# Patient Record
Sex: Male | Born: 1940
Health system: Southern US, Community
[De-identification: ages and names within clinical notes are randomized; demographics above are authoritative.]

## PROBLEM LIST (undated history)

## (undated) DIAGNOSIS — E119 Type 2 diabetes mellitus without complications: Secondary | ICD-10-CM

## (undated) DIAGNOSIS — I519 Heart disease, unspecified: Secondary | ICD-10-CM

## (undated) DIAGNOSIS — E785 Hyperlipidemia, unspecified: Secondary | ICD-10-CM

## (undated) DIAGNOSIS — G43909 Migraine, unspecified, not intractable, without status migrainosus: Secondary | ICD-10-CM

## (undated) DIAGNOSIS — I219 Acute myocardial infarction, unspecified: Secondary | ICD-10-CM

## (undated) DIAGNOSIS — D649 Anemia, unspecified: Secondary | ICD-10-CM

## (undated) DIAGNOSIS — E611 Iron deficiency: Secondary | ICD-10-CM

## (undated) DIAGNOSIS — I2510 Atherosclerotic heart disease of native coronary artery without angina pectoris: Secondary | ICD-10-CM

## (undated) DIAGNOSIS — M48061 Spinal stenosis, lumbar region without neurogenic claudication: Secondary | ICD-10-CM

## (undated) DIAGNOSIS — H9190 Unspecified hearing loss, unspecified ear: Secondary | ICD-10-CM

## (undated) DIAGNOSIS — M79606 Pain in leg, unspecified: Secondary | ICD-10-CM

## (undated) DIAGNOSIS — M1990 Unspecified osteoarthritis, unspecified site: Secondary | ICD-10-CM

## (undated) DIAGNOSIS — K219 Gastro-esophageal reflux disease without esophagitis: Secondary | ICD-10-CM

## (undated) HISTORY — PX: CARDIAC CATHETERIZATION: SHX172

## (undated) HISTORY — DX: Hyperlipidemia, unspecified: E78.5

## (undated) HISTORY — DX: Type 2 diabetes mellitus without complications: E11.9

## (undated) HISTORY — DX: Migraine, unspecified, not intractable, without status migrainosus: G43.909

## (undated) HISTORY — DX: Atherosclerotic heart disease of native coronary artery without angina pectoris: I25.10

## (undated) HISTORY — DX: Iron deficiency: E61.1

## (undated) HISTORY — DX: Pain in leg, unspecified: M79.606

## (undated) HISTORY — PX: CORONARY ARTERY BYPASS GRAFT: SHX141

## (undated) HISTORY — DX: Unspecified hearing loss, unspecified ear: H91.90

## (undated) HISTORY — DX: Heart disease, unspecified: I51.9

## (undated) HISTORY — DX: Gastro-esophageal reflux disease without esophagitis: K21.9

## (undated) HISTORY — DX: Spinal stenosis, lumbar region without neurogenic claudication: M48.061

---

## 2006-08-05 DIAGNOSIS — I219 Acute myocardial infarction, unspecified: Secondary | ICD-10-CM

## 2006-08-05 HISTORY — DX: Acute myocardial infarction, unspecified: I21.9

## 2015-02-22 ENCOUNTER — Ambulatory Visit (INDEPENDENT_AMBULATORY_CARE_PROVIDER_SITE_OTHER): Payer: Medicaid Other | Admitting: Neurology

## 2015-02-22 ENCOUNTER — Encounter: Payer: Self-pay | Admitting: Neurology

## 2015-02-22 ENCOUNTER — Telehealth: Payer: Self-pay

## 2015-02-22 VITALS — BP 135/76 | HR 65 | Ht 64.0 in | Wt 157.6 lb

## 2015-02-22 DIAGNOSIS — E0842 Diabetes mellitus due to underlying condition with diabetic polyneuropathy: Secondary | ICD-10-CM

## 2015-02-22 DIAGNOSIS — R200 Anesthesia of skin: Secondary | ICD-10-CM

## 2015-02-22 DIAGNOSIS — R202 Paresthesia of skin: Secondary | ICD-10-CM | POA: Diagnosis not present

## 2015-02-22 DIAGNOSIS — M5417 Radiculopathy, lumbosacral region: Secondary | ICD-10-CM | POA: Diagnosis not present

## 2015-02-22 DIAGNOSIS — E1140 Type 2 diabetes mellitus with diabetic neuropathy, unspecified: Secondary | ICD-10-CM | POA: Insufficient documentation

## 2015-02-22 MED ORDER — METANX 3-35-2 MG PO TABS: 1.0000 | ORAL_TABLET | Freq: Every day | ORAL | Status: DC

## 2015-02-22 MED ORDER — TOPIRAMATE 25 MG PO TABS: 25.0000 mg | ORAL_TABLET | Freq: Two times a day (BID) | ORAL | Status: DC

## 2015-02-22 NOTE — Progress Notes (Signed)
[facility] [address] [place]. [place] [id number] [phone]       OFFICE CONSULT NOTE  Mr. Paul Villarreal Date of Birth:  12/18/1940 Medical Record Number:  [id number]   Referring MD:  Kathryne SharperSyed Arfeen Reason for Referral:  Thigh and leg pain and numbness HPI: Mr Paul Villarreal is a pleasant [place] male who has had long-standing thigh and leg pain and paresthesias for the last couple of years. He is accompanied by his daughter who is a Therapist, sportspsychiatrist as well as son-in-law who both help with the history. These seem to be more pronounced over the last 6-7 months. He describes a burning sensation on numbness on the top of his feet which is intermittent and more noticeable with prolonged walking. He does find some partial relief by resting and sitting. He does notices at night but it's not quite bothersome and he does not have to get up or is not unable to sleep because of this. Describes also pain in his buttock more on the right compared to the left which often radiates down the back of his thighs of the knee but never all the way down into the toes. This is described at times has shooting pain at times is pulling sensation. He denies any severe back pain or pain shooting from the back down the leg. He had previously been diagnosed with spinal stenosis aspirin and an MRI done in [place] several years ago. However he is unable to provide me with details and I do not have those records to review. He does have a history of long-standing diabetes but it appears to be well controlled for the last hemoglobin A1c being 6.2. He was seen by a neurologist in [place] last year and treated with a 3 week course of stay regards but I do not have any records to review. He he was prescribed Neurontin 100 mg by primary physician which he took only for a few weeks and discontinued as he felt it did not help his symptoms. He denies any back injury, herniated disc, gait or balance problems.  He has learned to be careful while climbing steps holding onto the railings but denies any significant balance problems in the dark or in the shower.  ROS:   14 system review of systems is positive for back pain, thigh pain, leg pain, tingling, numbness, gait and balance difficulties and all other systems negative  PMH:  Past Medical History  Diagnosis Date  . Diabetes mellitus without complication   . Hyperlipemia   . Heart disease   . GERD (gastroesophageal reflux disease)   . Degenerative lumbar spinal stenosis   . Coronary artery disease   . Iron deficiency   . Decreased hearing   . Lower extremity pain   . Nonintractable chronic migraine     Social History:  History   Social History  . Marital Status: Married    Spouse Name: N/A  . Number of Children: 3  . Years of Education: N/A   Occupational History  . retired    Social History Main Topics  . Smoking status: Never Smoker   . Smokeless tobacco: Not on file  . Alcohol Use: No  . Drug Use: No  . Sexual Activity: Not on file   Other Topics Concern  . Not on file   Social History Narrative   Lives with daughter and wife   2 cups of caffeine a day        Medications:   No  current outpatient prescriptions on file prior to visit.   No current facility-administered medications on file prior to visit.    Allergies:  No Known Allergies  Physical Exam General: well developed, well nourished, seated, in no evident distress Head: head normocephalic and atraumatic.   Neck: supple with no carotid or supraclavicular bruits Cardiovascular: regular rate and rhythm, no murmurs Musculoskeletal: no deformity. Straight leg raising test shows limitation of right hip flexion to 70 due to muscle spasm and left leg 80. No paraspinal spasm or tenderness Skin:  no rash/petichiae Vascular:  Normal pulses all extremities  Neurologic Exam Mental Status: Awake and fully alert. Oriented to place and time. Recent and remote  memory intact. Attention span, concentration and fund of knowledge appropriate. Mood and affect appropriate.  Cranial Nerves: Fundoscopic exam reveals not done  . Pupils equal, briskly reactive to light. Extraocular movements full without nystagmus. Visual fields full to confrontation. Hearing intact. Facial sensation intact. Face, tongue, palate moves normally and symmetrically.  Motor: Normal bulk and tone. Normal strength in all tested extremity muscles. Sensory.: intact to touch , pinprick , position and vibratory sensation. Mild hyperesthesia over her bottom of both feet. Impaired vibration sensation from ankle down left foot more than right. Romberg sign is negative. Position sense is preserved bilaterally. Coordination: Rapid alternating movements normal in all extremities. Finger-to-nose and heel-to-shin performed accurately bilaterally. Gait and Station: Arises from chair without difficulty. Stance is normal. Gait demonstrates normal stride length and balance . Able to heel, toe and tandem walk with minimal difficulty.  Reflexes: 1+ and symmetric except both ankle jerks are absent. Toes downgoing.       ASSESSMENT: 20 year [place] male with a long-standing history of lower extremity paresthesias likely from underlying diabetic sensory neuropathy and posterior thigh and buttock pain possibly from S1 radiculopathy and lumbar spinal stenosis.    PLAN: I had a long discussion with the patient, son-in-law and daughter with regards to his chronic symptoms of paresthesias in the feet as well as posterior thigh pain likely represents S1 radiculopathy from degenerative spine disease with a component of underlying diabetic neuropathy. I recommend a trial of Topamax 25 g twice daily to be increase if tolerated without side effects. I have discussed side effects with the patient and advised him to call me if needed. Check EMG nerve conduction study to look at the relative contribution of S1  radiculopathy versus diabetic neuropathy. I have also given him regular back stretching exercises. I also given a prescription of Metanx to help with neuropathic pain. I have also asked the family to get me records from his prior MRI scan done in [place] several years ago for comparison He was advised to return for follow-up in 2 months or call earlier if necessary. Delia Heady, MD  Note: This document was prepared with digital dictation and possible smart phrase technology. Any transcriptional errors that result from this process are unintentional.

## 2015-02-22 NOTE — Patient Instructions (Addendum)
I had a long discussion with the patient, son-in-law and daughter with regards to his chronic symptoms of paresthesias in the feet as well as posterior thigh pain likely represents S1 radiculopathy from degenerative spine disease with a component of underlying diabetic neuropathy. I recommend a trial of Topamax 25 g twice daily to be increase if tolerated without side effects. I have discussed side effects with the patient and advised him to call me if needed. Check EMG nerve conduction study to look at ther relative contribution of S1 radiculopathy versus diabetic neuropathy. I have also given him regular back stretching exercises. I also given a prescription of Metanx to help with neuropathic pain. I have also asked the family to get me records from his prior MRI scan done in [place] several years ago for comparison He was advised to return for follow-up in 2 months or call earlier if necessary.  Diabetic Neuropathy Diabetic neuropathy is a nerve disease or nerve damage that is caused by diabetes mellitus. About half of all people with diabetes mellitus have some form of nerve damage. Nerve damage is more common in those who have had diabetes mellitus for many years and who generally have not had good control of their blood sugar (glucose) level. Diabetic neuropathy is a common complication of diabetes mellitus. There are three more common types of diabetic neuropathy and a fourth type that is less common and less understood:   Peripheral neuropathy--This is the most common type of diabetic neuropathy. It causes damage to the nerves of the feet and legs first and then eventually the hands and arms.The damage affects the ability to sense touch.  Autonomic neuropathy--This type causes damage to the autonomic nervous system, which controls the following functions:  Heartbeat.  Body temperature.  Blood pressure.  Urination.  Digestion.  Sweating.  Sexual function.  Focal neuropathy--Focal  neuropathy can be painful and unpredictable and occurs most often in older adults with diabetes mellitus. It involves a specific nerve or one area and often comes on suddenly. It usually does not cause long-term problems.  Radiculoplexus neuropathy-- Sometimes called lumbosacral radiculoplexus neuropathy, radiculoplexus neuropathy affects the nerves of the thighs, hips, buttocks, or legs. It is more common in people with type 2 diabetes mellitus and in older men. It is characterized by debilitating pain, weakness, and atrophy, usually in the thigh muscles. CAUSES  The cause of peripheral, autonomic, and focal neuropathies is diabetes mellitus that is uncontrolled and high glucose levels. The cause of radiculoplexus neuropathy is unknown. However, it is thought to be caused by inflammation related to uncontrolled glucose levels. SIGNS AND SYMPTOMS  Peripheral Neuropathy Peripheral neuropathy develops slowly over time. When the nerves of the feet and legs no longer work there may be:   Burning, stabbing, or aching pain in the legs or feet.  Inability to feel pressure or pain in your feet. This can lead to:  Thick calluses over pressure areas.  Pressure sores.  Ulcers.  Foot deformities.  Reduced ability to feel temperature changes.  Muscle weakness. Autonomic Neuropathy The symptoms of autonomic neuropathy vary depending on which nerves are affected. Symptoms may include:  Problems with digestion, such as:  Feeling sick to your stomach (nausea).  Vomiting.  Bloating.  Constipation.  Diarrhea.  Abdominal pain.  Difficulty with urination. This occurs if you lose your ability to sense when your bladder is full. Problems include:  Urine leakage (incontinence).  Inability to empty your bladder completely (retention).  Rapid or irregular heartbeat (palpitations).  Blood pressure drops when you stand up (orthostatic hypotension). When you stand up you may  feel:  Dizzy.  Weak.  Faint.  In men, inability to attain and maintain an erection.  In women, vaginal dryness and problems with decreased sexual desire and arousal.  Problems with body temperature regulation.  Increased or decreased sweating. Focal Neuropathy  Abnormal eye movements or abnormal alignment of both eyes.  Weakness in the wrist.  Foot drop. This results in an inability to lift the foot properly and abnormal walking or foot movement.  Paralysis on one side of your face (Bell palsy).  Chest or abdominal pain. Radiculoplexus Neuropathy  Sudden, severe pain in your hip, thigh, or buttocks.  Weakness and wasting of thigh muscles.  Difficulty rising from a seated position.  Abdominal swelling.  Unexplained weight loss (usually more than 10 lb [4.5 kg]). DIAGNOSIS  Peripheral Neuropathy Your senses may be tested. Sensory function testing can be done with:  A light touch using a monofilament.  A vibration with tuning fork.  A sharp sensation with a pin prick. Other tests that can help diagnose neuropathy are:  Nerve conduction velocity. This test checks the transmission of an electrical current through a nerve.  Electromyography. This shows how muscles respond to electrical signals transmitted by nearby nerves.  Quantitative sensory testing. This is used to assess how your nerves respond to vibrations and changes in temperature. Autonomic Neuropathy Diagnosis is often based on reported symptoms. Tell your health care provider if you experience:   Dizziness.   Constipation.   Diarrhea.   Inappropriate urination or inability to urinate.   Inability to get or maintain an erection.  Tests that may be done include:   Electrocardiography or Holter monitor. These are tests that can help show problems with the heart rate or heart rhythm.   An X-ray exam may be done. Focal Neuropathy Diagnosis is made based on your symptoms and what your  health care provider finds during your exam. Other tests may be done. They may include:  Nerve conduction velocities. This checks the transmission of electrical current through a nerve.  Electromyography. This shows how muscles respond to electrical signals transmitted by nearby nerves.  Quantitative sensory testing. This test is used to assess how your nerves respond to vibration and changes in temperature. Radiculoplexus Neuropathy  Often the first thing is to eliminate any other issue or problems that might be the cause, as there is no stick test for diagnosis.  X-ray exam of your spine and lumbar region.  Spinal tap to rule out cancer.  MRI to rule out other lesions. TREATMENT  Once nerve damage occurs, it cannot be reversed. The goal of treatment is to keep the disease or nerve damage from getting worse and affecting more nerve fibers. Controlling your blood glucose level is the key. Most people with radiculoplexus neuropathy see at least a partial improvement over time. You will need to keep your blood glucose and HbA1c levels in the target range determined by your health care provider. Things that help control blood glucose levels include:   Blood glucose monitoring.   Meal planning.   Physical activity.   Diabetes medicine.  Over time, maintaining lower blood glucose levels helps lessen symptoms. Sometimes, prescription pain medicine is needed. HOME CARE INSTRUCTIONS:  Do not smoke.  Keep your blood glucose level in the range that you and your health care provider have determined acceptable for you.  Keep your blood pressure level in the range that  you and your health care provider have determined acceptable for you.  Eat a well-balanced diet.  Be active every day.  Check your feet every day. SEEK MEDICAL CARE IF:   You have burning, stabbing, or aching pain in the legs or feet.  You are unable to feel pressure or pain in your feet.  You develop problems with  digestion such as:  Nausea.  Vomiting.  Bloating.  Constipation.  Diarrhea.  Abdominal pain.  You have difficulty with urination, such as:  Incontinence.  Retention.  You have palpitations.  You develop orthostatic hypotension. When you stand up you may feel:  Dizzy.  Weak.  Faint. Lumbosacral Radiculopathy Lumbosacral radiculopathy is a pinched nerve or nerves in the low back (lumbosacral area). When this happens you may have weakness in your legs and may not be able to stand on your toes. You may have pain going down into your legs. There may be difficulties with walking normally. There are many causes of this problem. Sometimes this may happen from an injury, or simply from arthritis or boney problems. It may also be caused by other illnesses such as diabetes. If there is no improvement after treatment, further studies may be done to find the exact cause. DIAGNOSIS  X-rays may be needed if the problems become long standing. Electromyograms may be done. This study is one in which the working of nerves and muscles is studied. HOME CARE INSTRUCTIONS  Applications of ice packs may be helpful. Ice can be used in a plastic bag with a towel around it to prevent frostbite to skin. This may be used every 2 hours for 20 to 30 minutes, or as needed, while awake, or as directed by your caregiver. Only take over-the-counter or prescription medicines for pain, discomfort, or fever as directed by your caregiver. If physical therapy was prescribed, follow your caregiver's directions. SEEK IMMEDIATE MEDICAL CARE IF:  You have pain not controlled with medications. You seem to be getting worse rather than better. You develop increasing weakness in your legs. You develop loss of bowel or bladder control. You have difficulty with walking or balance, or develop clumsiness in the use of your legs. You have a fever. MAKE SURE YOU:  Understand these instructions. Will watch your  condition. Will get help right away if you are not doing well or get worse. Document Released: 07/22/2005 Document Revised: 10/14/2011 Document Reviewed: 03/11/2008 [facility] Patient Information 2015 [place]. This information is not intended to replace advice given to you by your health care provider. Make sure you discuss any questions you have with your health care provider.   You cannot attain and maintain an erection (in men).  You have vaginal dryness and problems with decreased sexual desire and arousal (in women).  You have severe pain in your thighs, legs, or buttocks.  You have unexplained weight loss. Document Released: 09/30/2001 Document Revised: 05/12/2013 Document Reviewed: 12/31/2012 [facility] Patient Information 2015 [place]. This information is not intended to replace advice given to you by your health care provider. Make sure you discuss any questions you have with your health care provider.

## 2015-02-22 NOTE — Telephone Encounter (Signed)
LVM for patients daughter to inform her the her father has appointment with Dr. Pearlean BrownieSethi today 7/20 at 3:15 and to arrive 15 minutes early.

## 2015-03-15 ENCOUNTER — Encounter: Payer: Self-pay | Admitting: Neurology

## 2015-03-15 ENCOUNTER — Ambulatory Visit (INDEPENDENT_AMBULATORY_CARE_PROVIDER_SITE_OTHER): Payer: Self-pay | Admitting: Neurology

## 2015-03-15 ENCOUNTER — Ambulatory Visit (INDEPENDENT_AMBULATORY_CARE_PROVIDER_SITE_OTHER): Payer: Medicaid Other | Admitting: Neurology

## 2015-03-15 DIAGNOSIS — E0842 Diabetes mellitus due to underlying condition with diabetic polyneuropathy: Secondary | ICD-10-CM

## 2015-03-15 DIAGNOSIS — M5417 Radiculopathy, lumbosacral region: Secondary | ICD-10-CM

## 2015-03-15 NOTE — Procedures (Signed)
     HISTORY:  Paul Villarreal is a 74 year old gentleman with a 6-7 month history of low back pain and pain down both legs, left greater than right with paresthesias down the legs as well. The patient denies any weakness of the legs. He is being evaluated for a possible neuropathy or a lumbosacral radiculopathy.  NERVE CONDUCTION STUDIES:  Nerve conduction studies were performed on both lower extremities. The distal motor latencies and motor amplitudes for the peroneal and posterior tibial nerves were within normal limits. The nerve conduction velocities for these nerves were also normal. The H reflex latencies were absent bilaterally. The sensory latencies for the peroneal nerves were within normal limits.   EMG STUDIES:  EMG study was performed on the right lower extremity:  The tibialis anterior muscle reveals 2 to 4K motor units with full recruitment. No fibrillations or positive waves were seen. The peroneus tertius muscle reveals 2 to 5K motor units with decreased recruitment. No fibrillations or positive waves were seen. The medial gastrocnemius muscle reveals 2 to 4K motor units with slightly decreased recruitment. No fibrillations or positive waves were seen. The vastus lateralis muscle reveals 2 to 4K motor units with full recruitment. No fibrillations or positive waves were seen. The iliopsoas muscle reveals 2 to 4K motor units with full recruitment. No fibrillations or positive waves were seen. The biceps femoris muscle (long head) reveals 2 to 4K motor units with full recruitment. No fibrillations or positive waves were seen. The lumbosacral paraspinal muscles were tested at 3 levels, and revealed no abnormalities of insertional activity at the upper and lower levels tested. 1+ positive waves were seen at the mid-level. There was good relaxation.  EMG study was performed on the left lower extremity:  The tibialis anterior muscle reveals 2 to 5K motor units with decreased  recruitment. No fibrillations or positive waves were seen. The peroneus tertius muscle reveals 2 to 4K motor units with slightly decreased recruitment. No fibrillations or positive waves were seen. The medial gastrocnemius muscle reveals 1 to 4K motor units with slightly decreased recruitment. 1+ positive waves were seen. The vastus lateralis muscle reveals 2 to 4K motor units with full recruitment. No fibrillations or positive waves were seen. The iliopsoas muscle reveals 2 to 4K motor units with full recruitment. No fibrillations or positive waves were seen. The biceps femoris muscle (long head) reveals 2 to 4K motor units with full recruitment. No fibrillations or positive waves were seen. The lumbosacral paraspinal muscles were tested at 3 levels, and revealed no abnormalities of insertional activity at the middle and lower levels tested. 1+ positive waves were seen at the upper level. There was good relaxation.   IMPRESSION:  Nerve conduction studies done on both lower extremities were relatively unremarkable, without evidence of a peripheral neuropathy. EMG of the right lower extremity shows findings most consistent with a mild chronic S1 radiculopathy. Similar findings are noted on the left side, with some mild acute components as well, and evidence of a mild overlying L5 chronic radiculopathy is also seen.  Marlan Palau MD 03/15/2015 4:25 PM  [facility] [address] [place], [place] [address]  Phone [phone] Fax [phone]

## 2015-03-15 NOTE — Progress Notes (Signed)
Please refer to EMG and nerve conduction study procedure note. 

## 2015-03-16 ENCOUNTER — Telehealth: Payer: Self-pay

## 2015-03-16 NOTE — Telephone Encounter (Signed)
Patient's daughter called, I relayed message, she understood.

## 2015-03-16 NOTE — Telephone Encounter (Addendum)
Left voice for message to call back to give her results of test. Okay to tell patient that  EMG/Nerve conduction study did not show any evidence of nerve damage in the feet but mild old chronic pinched nerves in his back which do explain his symptoms. If he does not have relief with pain medicines steroid injection into the back is an option which I can discuss with him if interested     Attached to     The findings above were from Dr Pearlean Brownie notes. Thanks

## 2015-03-21 NOTE — Telephone Encounter (Signed)
Patient's daughter calling stating patient is having increased pain in his back. Please call and advise. She can be reached at [phone].

## 2015-03-21 NOTE — Telephone Encounter (Signed)
I spoke to the patient's daughter. He still having pain but is tolerating Topamax 25 mg twice daily without side effects. I recommended increasing Topamax to 50 mg twice daily. If he has no significant relief may repeat an MRI scan of the lumbar spine prior to considering referral for epidural steroidal injection.

## 2015-03-26 ENCOUNTER — Ambulatory Visit
Admission: RE | Admit: 2015-03-26 | Discharge: 2015-03-26 | Disposition: A | Discharge status: Home / Self Care | Payer: Medicaid Other | Source: Ambulatory Visit | Attending: Neurology | Admitting: Neurology

## 2015-03-26 DIAGNOSIS — M5417 Radiculopathy, lumbosacral region: Secondary | ICD-10-CM | POA: Diagnosis not present

## 2015-03-29 ENCOUNTER — Other Ambulatory Visit: Payer: Self-pay | Admitting: Neurology

## 2015-03-29 DIAGNOSIS — M48061 Spinal stenosis, lumbar region without neurogenic claudication: Secondary | ICD-10-CM

## 2015-04-27 ENCOUNTER — Other Ambulatory Visit: Payer: Self-pay | Admitting: Neurological Surgery

## 2015-05-01 ENCOUNTER — Encounter (HOSPITAL_COMMUNITY): Payer: Self-pay | Admitting: *Deleted

## 2015-05-01 MED ORDER — CEFAZOLIN SODIUM-DEXTROSE 2-3 GM-% IV SOLR
2.0000 g | INTRAVENOUS | Status: AC
Start: 2015-05-02 — End: 2015-05-02
  Administered 2015-05-02: 2 g via INTRAVENOUS
  Filled 2015-05-01: qty 50

## 2015-05-01 NOTE — Progress Notes (Signed)
Pt SDW-Pre-op assessment completed by pt daughter, Dr. Forrestine Him. MD stated that she will bring pt copy of stress and echo reports in on DOS. MD made aware to hold diabetic medications the morning of procedure, vitamins and herbal medications such as fish oil. MD verbalized understanding of all pre-op instructions.

## 2015-05-01 NOTE — Progress Notes (Addendum)
Anesthesia Chart Review: Patient is a 74 year old male scheduled for left L4-5, L5-S1 laminectomy tomorrow afternoon by Dr. Danielle Dess.  For unclear reasons, patient is a same day work-up.  I received a signed cardiology clearance note from Dr. Wynonia Hazard [facility]) dated 04/24/15. There are some records in Care Everywhere, but last EKG tracing, 11/2014 24-48 hour Holter monitor results, 04/2014 stress test results, and last echo report are still pending. Last visit was on 10/26/14 which states, "Negative nuclear scan last year and normal LVEF by 2-D echo."  History includes CAD/MI s/p CABG X 3 ~ 10 years ago in [place], HTN, dyslipidemia, DM2, non-smoker. Referred to Dr. Gwen Her by Dr. Lorelle Formosa in [place], [place].  Meds include ASA 81 mg, glyburide, metformin, fish oil, Prilosec, Crestor.  Surgery is scheduled for after 3:30 PM. Hopefully, we'll have additional records by then. He is for labs on arrival.  Velna Ochs [facility] Short Stay Center/Anesthesiology Phone [phone] 05/01/2015 6:19 PM  Addendum: Additional cardiology records just received.   05/02/14 Nuclear stress test: The post stress myocardial perfusion images show a normal pattern of perfusion in all regions. There post stress LV is normal in size. There is no scintigraphic evidence of inducible myocardial ischemia. Post-stress EF 65%. Global LV systolic function is normal.  03/7563 Holter: Rare isolated PACs otherwise no significant arrhythmias. Maximum HR 114 bpm. Minimum HR 49 bpm with average 87 bpm. Recommend: Stay off Toprol.  No EKG or echo report received.  Velna Ochs [facility] Short Stay Center/Anesthesiology Phone [phone] 05/02/2015 12:11 PM

## 2015-05-02 ENCOUNTER — Inpatient Hospital Stay (HOSPITAL_COMMUNITY)
Admission: RE | Admit: 2015-05-02 | Discharge: 2015-05-04 | DRG: 517 | Disposition: A | Discharge status: Home / Self Care | Payer: Medicaid Other | Source: Ambulatory Visit | Attending: Neurological Surgery | Admitting: Neurological Surgery

## 2015-05-02 ENCOUNTER — Encounter (HOSPITAL_COMMUNITY): Payer: Self-pay | Admitting: *Deleted

## 2015-05-02 ENCOUNTER — Ambulatory Visit (HOSPITAL_COMMUNITY): Payer: Medicaid Other

## 2015-05-02 ENCOUNTER — Ambulatory Visit (HOSPITAL_COMMUNITY): Payer: Medicaid Other | Admitting: Vascular Surgery

## 2015-05-02 ENCOUNTER — Encounter (HOSPITAL_COMMUNITY)
Admission: RE | Disposition: A | Discharge status: Home / Self Care | Payer: Self-pay | Source: Ambulatory Visit | Attending: Neurological Surgery

## 2015-05-02 DIAGNOSIS — M1990 Unspecified osteoarthritis, unspecified site: Secondary | ICD-10-CM | POA: Diagnosis present

## 2015-05-02 DIAGNOSIS — M4807 Spinal stenosis, lumbosacral region: Secondary | ICD-10-CM | POA: Diagnosis present

## 2015-05-02 DIAGNOSIS — M5117 Intervertebral disc disorders with radiculopathy, lumbosacral region: Secondary | ICD-10-CM | POA: Diagnosis present

## 2015-05-02 DIAGNOSIS — Z823 Family history of stroke: Secondary | ICD-10-CM | POA: Diagnosis not present

## 2015-05-02 DIAGNOSIS — I2510 Atherosclerotic heart disease of native coronary artery without angina pectoris: Secondary | ICD-10-CM | POA: Diagnosis present

## 2015-05-02 DIAGNOSIS — E119 Type 2 diabetes mellitus without complications: Secondary | ICD-10-CM | POA: Diagnosis present

## 2015-05-02 DIAGNOSIS — E785 Hyperlipidemia, unspecified: Secondary | ICD-10-CM | POA: Diagnosis present

## 2015-05-02 DIAGNOSIS — E611 Iron deficiency: Secondary | ICD-10-CM | POA: Diagnosis present

## 2015-05-02 DIAGNOSIS — M48061 Spinal stenosis, lumbar region without neurogenic claudication: Secondary | ICD-10-CM | POA: Diagnosis present

## 2015-05-02 DIAGNOSIS — Z951 Presence of aortocoronary bypass graft: Secondary | ICD-10-CM | POA: Diagnosis not present

## 2015-05-02 DIAGNOSIS — Z419 Encounter for procedure for purposes other than remedying health state, unspecified: Secondary | ICD-10-CM

## 2015-05-02 DIAGNOSIS — I252 Old myocardial infarction: Secondary | ICD-10-CM

## 2015-05-02 DIAGNOSIS — K219 Gastro-esophageal reflux disease without esophagitis: Secondary | ICD-10-CM | POA: Diagnosis present

## 2015-05-02 HISTORY — PX: LUMBAR LAMINECTOMY/DECOMPRESSION MICRODISCECTOMY: SHX5026

## 2015-05-02 HISTORY — DX: Unspecified osteoarthritis, unspecified site: M19.90

## 2015-05-02 HISTORY — DX: Anemia, unspecified: D64.9

## 2015-05-02 HISTORY — DX: Acute myocardial infarction, unspecified: I21.9

## 2015-05-02 LAB — GLUCOSE, CAPILLARY
GLUCOSE-CAPILLARY: 117 mg/dL — AB (ref 65–99)
Glucose-Capillary: 215 mg/dL — ABNORMAL HIGH (ref 65–99)

## 2015-05-02 LAB — SURGICAL PCR SCREEN
MRSA, PCR: NEGATIVE
STAPHYLOCOCCUS AUREUS: POSITIVE — AB

## 2015-05-02 LAB — BASIC METABOLIC PANEL
Anion gap: 10 (ref 5–15)
BUN: 12 mg/dL (ref 6–20)
CALCIUM: 9 mg/dL (ref 8.9–10.3)
CO2: 22 mmol/L (ref 22–32)
CREATININE: 0.93 mg/dL (ref 0.61–1.24)
Chloride: 100 mmol/L — ABNORMAL LOW (ref 101–111)
GFR calc non Af Amer: >60 mL/min (ref >60)
Glucose, Bld: 106 mg/dL — ABNORMAL HIGH (ref 65–99)
Potassium: 3.8 mmol/L (ref 3.5–5.1)
SODIUM: 132 mmol/L — AB (ref 135–145)

## 2015-05-02 LAB — CBC
HEMATOCRIT: 36.2 % — AB (ref 39.0–52.0)
Hemoglobin: 12.1 g/dL — ABNORMAL LOW (ref 13.0–17.0)
MCH: 27.8 pg (ref 26.0–34.0)
MCHC: 33.4 g/dL (ref 30.0–36.0)
MCV: 83.2 fL (ref 78.0–100.0)
Platelets: 269 10*3/uL (ref 150–400)
RBC: 4.35 MIL/uL (ref 4.22–5.81)
RDW: 13.2 % (ref 11.5–15.5)
WBC: 8.4 10*3/uL (ref 4.0–10.5)

## 2015-05-02 IMAGING — CR DG LUMBAR SPINE 2-3V
1 series · 1 of 1 positions shown · non-contrast
Comparison: None.

CLINICAL DATA: 74-year-old male for left L4 through S1 laminectomy
for spinal stenosis. Initial encounter.

EXAM:
LUMBAR SPINE - 2-3 VIEW

[lat]
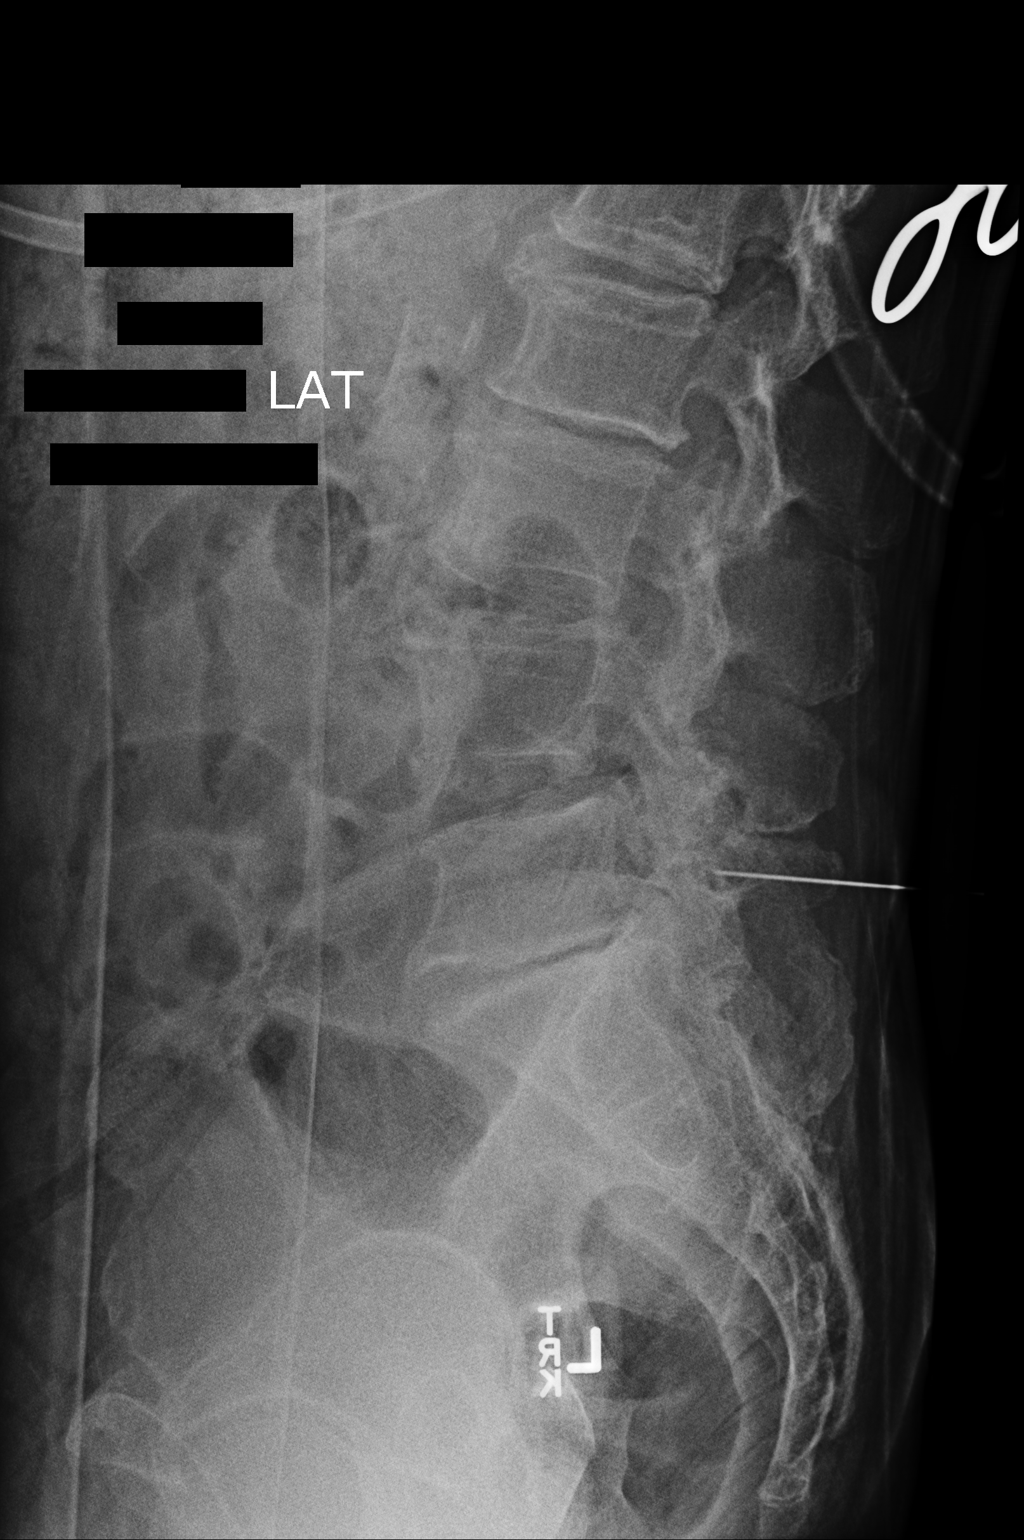

[1 of 1 positions shown; findings below may reference images not displayed]

FINDINGS: Two with intraoperative lateral views of the lumbar spine are
submitted for review.

There are no comparison exams available. For the present
examination, the last fully open disc space will be labeled the
L5-S1 level.

On the film marked #1, there is a metallic probe posterior to the
mid L5 vertebral body level.

On the second film submitted for review there is a metallic probe
directed towards the superior aspect of the L5 vertebral body with
metallic rakes spanning between the upper L5 and upper S1 level.

Surgical instruments overlie the pelvis.
IMPRESSION: On the second film submitted for review there is a metallic probe
directed towards the superior aspect of the L5 vertebral body with
metallic rakes spanning between the upper L5 and upper S1 level.

## 2015-05-02 IMAGING — CR DG LUMBAR SPINE 2-3V
1 series · 1 of 1 positions shown · non-contrast
Comparison: None.

CLINICAL DATA: 74-year-old male for left L4 through S1 laminectomy
for spinal stenosis. Initial encounter.

EXAM:
LUMBAR SPINE - 2-3 VIEW

[lat]
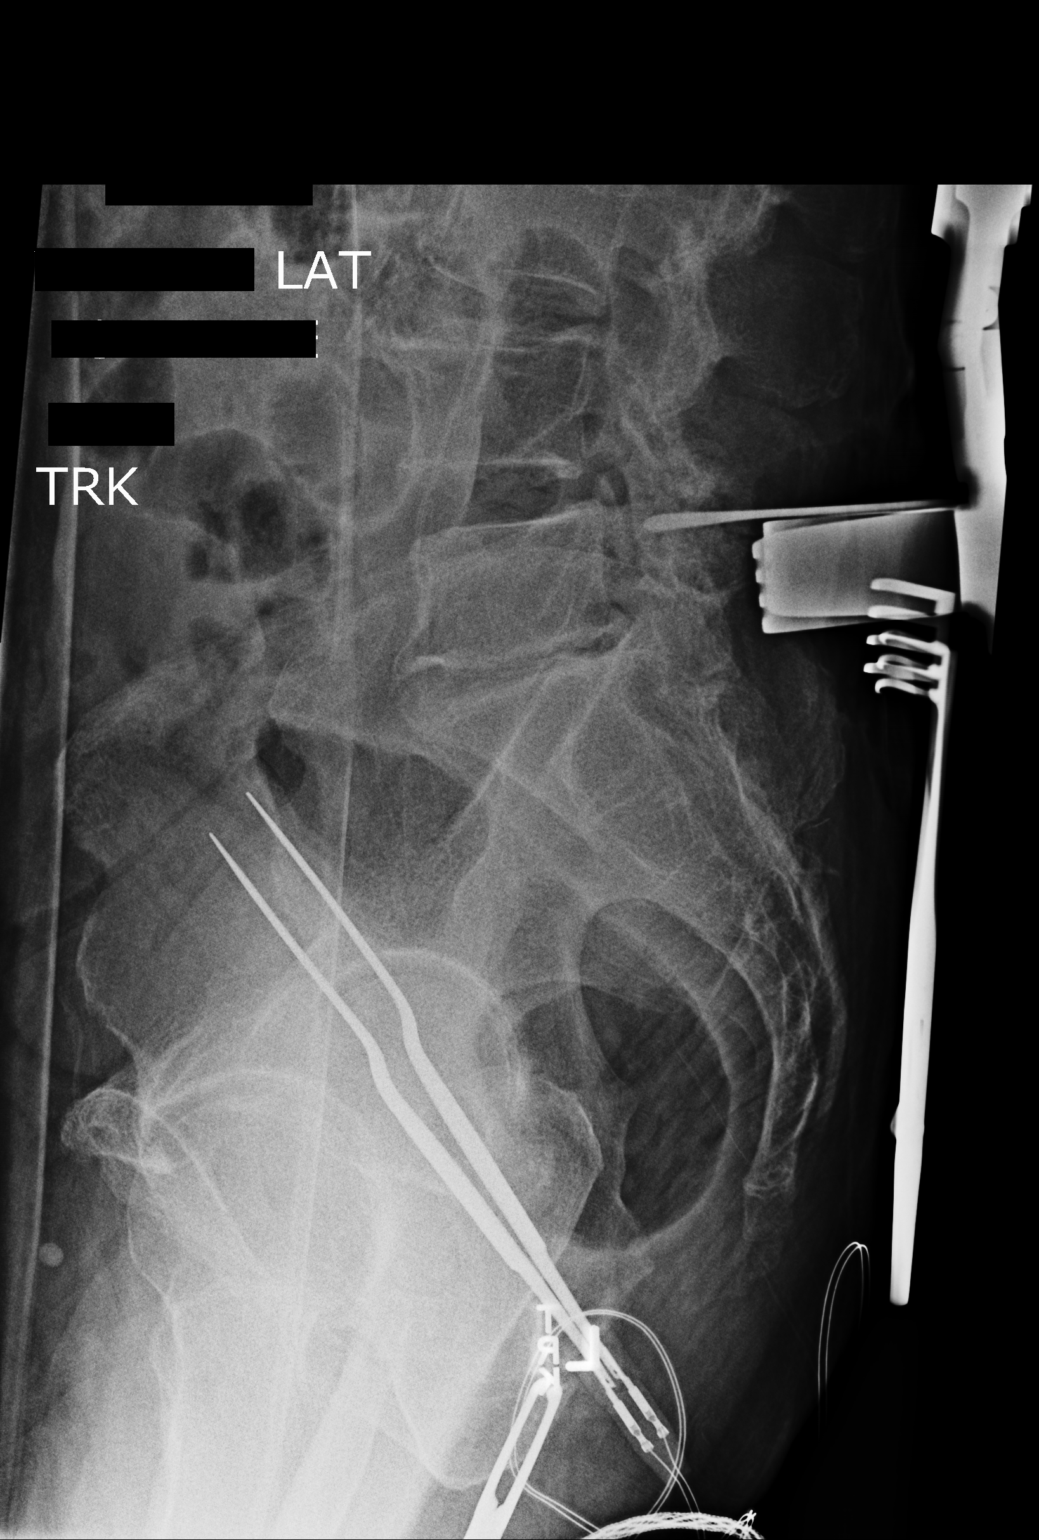

[1 of 1 positions shown; findings below may reference images not displayed]

FINDINGS: Two with intraoperative lateral views of the lumbar spine are
submitted for review.

There are no comparison exams available. For the present
examination, the last fully open disc space will be labeled the
L5-S1 level.

On the film marked #1, there is a metallic probe posterior to the
mid L5 vertebral body level.

On the second film submitted for review there is a metallic probe
directed towards the superior aspect of the L5 vertebral body with
metallic rakes spanning between the upper L5 and upper S1 level.

Surgical instruments overlie the pelvis.
IMPRESSION: On the second film submitted for review there is a metallic probe
directed towards the superior aspect of the L5 vertebral body with
metallic rakes spanning between the upper L5 and upper S1 level.

## 2015-05-02 SURGERY — LUMBAR LAMINECTOMY/DECOMPRESSION MICRODISCECTOMY 2 LEVELS
Anesthesia: General | Site: Back | Laterality: Left

## 2015-05-02 MED ORDER — ONDANSETRON HCL 4 MG/2ML IJ SOLN: 4.0000 mg | INTRAMUSCULAR | Status: DC | PRN

## 2015-05-02 MED ORDER — METFORMIN HCL 500 MG PO TABS
500.0000 mg | ORAL_TABLET | Freq: Two times a day (BID) | ORAL | Status: DC
  Administered 2015-05-03 – 2015-05-04 (×3): 500 mg via ORAL
  Filled 2015-05-02 (×3): qty 1

## 2015-05-02 MED ORDER — HEMOSTATIC AGENTS (NO CHARGE) OPTIME
TOPICAL | Status: DC | PRN
  Administered 2015-05-02: 1 via TOPICAL

## 2015-05-02 MED ORDER — DOCUSATE SODIUM 100 MG PO CAPS
100.0000 mg | ORAL_CAPSULE | Freq: Two times a day (BID) | ORAL | Status: DC
  Administered 2015-05-02 – 2015-05-04 (×4): 100 mg via ORAL
  Filled 2015-05-02 (×4): qty 1

## 2015-05-02 MED ORDER — ALUM & MAG HYDROXIDE-SIMETH 200-200-20 MG/5ML PO SUSP: 30.0000 mL | Freq: Four times a day (QID) | ORAL | Status: DC | PRN

## 2015-05-02 MED ORDER — ONDANSETRON HCL 4 MG/2ML IJ SOLN: 4.0000 mg | Freq: Once | INTRAMUSCULAR | Status: DC | PRN

## 2015-05-02 MED ORDER — SENNA 8.6 MG PO TABS
1.0000 | ORAL_TABLET | Freq: Two times a day (BID) | ORAL | Status: DC
  Administered 2015-05-02 – 2015-05-04 (×4): 8.6 mg via ORAL
  Filled 2015-05-02 (×4): qty 1

## 2015-05-02 MED ORDER — PHENOL 1.4 % MT LIQD: 1.0000 | OROMUCOSAL | Status: DC | PRN

## 2015-05-02 MED ORDER — ONDANSETRON HCL 4 MG/2ML IJ SOLN
INTRAMUSCULAR | Status: DC | PRN
  Administered 2015-05-02: 4 mg via INTRAVENOUS

## 2015-05-02 MED ORDER — SODIUM CHLORIDE 0.9 % IJ SOLN
3.0000 mL | Freq: Two times a day (BID) | INTRAMUSCULAR | Status: DC
  Administered 2015-05-02 – 2015-05-04 (×4): 3 mL via INTRAVENOUS

## 2015-05-02 MED ORDER — GLYBURIDE 1.25 MG PO TABS
1.2500 mg | ORAL_TABLET | ORAL | Status: DC
  Administered 2015-05-03: 1.25 mg via ORAL
  Filled 2015-05-02 (×2): qty 1

## 2015-05-02 MED ORDER — LIDOCAINE HCL (CARDIAC) 20 MG/ML IV SOLN
INTRAVENOUS | Status: DC | PRN
  Administered 2015-05-02: 100 mg via INTRAVENOUS

## 2015-05-02 MED ORDER — MIDAZOLAM HCL 5 MG/5ML IJ SOLN
INTRAMUSCULAR | Status: DC | PRN
  Administered 2015-05-02: 2 mg via INTRAVENOUS

## 2015-05-02 MED ORDER — BISACODYL 10 MG RE SUPP: 10.0000 mg | Freq: Every day | RECTAL | Status: DC | PRN

## 2015-05-02 MED ORDER — MEPERIDINE HCL 25 MG/ML IJ SOLN: 6.2500 mg | INTRAMUSCULAR | Status: DC | PRN

## 2015-05-02 MED ORDER — ONDANSETRON HCL 4 MG/2ML IJ SOLN
INTRAMUSCULAR | Status: AC
  Filled 2015-05-02: qty 2

## 2015-05-02 MED ORDER — DEXAMETHASONE SODIUM PHOSPHATE 4 MG/ML IJ SOLN
INTRAMUSCULAR | Status: AC
  Filled 2015-05-02: qty 2

## 2015-05-02 MED ORDER — MIDAZOLAM HCL 2 MG/2ML IJ SOLN
INTRAMUSCULAR | Status: AC
  Filled 2015-05-02: qty 4

## 2015-05-02 MED ORDER — METHOCARBAMOL 500 MG PO TABS
500.0000 mg | ORAL_TABLET | Freq: Four times a day (QID) | ORAL | Status: DC | PRN
  Administered 2015-05-04: 500 mg via ORAL
  Filled 2015-05-02: qty 1

## 2015-05-02 MED ORDER — PROPOFOL 10 MG/ML IV BOLUS
INTRAVENOUS | Status: DC | PRN
  Administered 2015-05-02: 120 mg via INTRAVENOUS

## 2015-05-02 MED ORDER — ACETAMINOPHEN 650 MG RE SUPP: 650.0000 mg | RECTAL | Status: DC | PRN

## 2015-05-02 MED ORDER — DEXAMETHASONE SODIUM PHOSPHATE 4 MG/ML IJ SOLN
INTRAMUSCULAR | Status: DC | PRN
  Administered 2015-05-02: 8 mg via INTRAVENOUS

## 2015-05-02 MED ORDER — LIDOCAINE-EPINEPHRINE 1 %-1:100000 IJ SOLN
INTRAMUSCULAR | Status: DC | PRN
  Administered 2015-05-02: 5 mL

## 2015-05-02 MED ORDER — MUPIROCIN 2 % EX OINT
1.0000 "application " | TOPICAL_OINTMENT | Freq: Once | CUTANEOUS | Status: AC
  Administered 2015-05-02: 1 via TOPICAL
  Filled 2015-05-02: qty 22

## 2015-05-02 MED ORDER — 0.9 % SODIUM CHLORIDE (POUR BTL) OPTIME
TOPICAL | Status: DC | PRN
  Administered 2015-05-02: 1000 mL

## 2015-05-02 MED ORDER — HYDROMORPHONE HCL 1 MG/ML IJ SOLN: 0.2500 mg | INTRAMUSCULAR | Status: DC | PRN

## 2015-05-02 MED ORDER — FENTANYL CITRATE (PF) 250 MCG/5ML IJ SOLN
INTRAMUSCULAR | Status: AC
  Filled 2015-05-02: qty 5

## 2015-05-02 MED ORDER — KETOROLAC TROMETHAMINE 15 MG/ML IJ SOLN
15.0000 mg | Freq: Four times a day (QID) | INTRAMUSCULAR | Status: AC
  Filled 2015-05-02 (×2): qty 1

## 2015-05-02 MED ORDER — LACTATED RINGERS IV SOLN
INTRAVENOUS | Status: DC
  Administered 2015-05-02 (×2): via INTRAVENOUS

## 2015-05-02 MED ORDER — ROSUVASTATIN CALCIUM 10 MG PO TABS
10.0000 mg | ORAL_TABLET | Freq: Every day | ORAL | Status: DC
  Administered 2015-05-02: 10 mg via ORAL
  Filled 2015-05-02 (×4): qty 1

## 2015-05-02 MED ORDER — THROMBIN 5000 UNITS EX SOLR
CUTANEOUS | Status: DC | PRN
  Administered 2015-05-02 (×2): 5000 [IU] via TOPICAL

## 2015-05-02 MED ORDER — SUGAMMADEX SODIUM 200 MG/2ML IV SOLN
INTRAVENOUS | Status: DC | PRN
  Administered 2015-05-02: 200 mg via INTRAVENOUS

## 2015-05-02 MED ORDER — ROCURONIUM BROMIDE 100 MG/10ML IV SOLN
INTRAVENOUS | Status: DC | PRN
  Administered 2015-05-02: 50 mg via INTRAVENOUS

## 2015-05-02 MED ORDER — DEXTROSE 5 % IV SOLN
500.0000 mg | Freq: Four times a day (QID) | INTRAVENOUS | Status: DC | PRN
  Filled 2015-05-02: qty 5

## 2015-05-02 MED ORDER — HYDROCODONE-ACETAMINOPHEN 5-325 MG PO TABS: 1.0000 | ORAL_TABLET | ORAL | Status: DC | PRN

## 2015-05-02 MED ORDER — MENTHOL 3 MG MT LOZG: 1.0000 | LOZENGE | OROMUCOSAL | Status: DC | PRN

## 2015-05-02 MED ORDER — EPHEDRINE SULFATE 50 MG/ML IJ SOLN
INTRAMUSCULAR | Status: DC | PRN
  Administered 2015-05-02: 15 mg via INTRAVENOUS
  Administered 2015-05-02: 10 mg via INTRAVENOUS

## 2015-05-02 MED ORDER — CEFAZOLIN SODIUM 1-5 GM-% IV SOLN
1.0000 g | Freq: Three times a day (TID) | INTRAVENOUS | Status: AC
  Administered 2015-05-03 (×2): 1 g via INTRAVENOUS
  Filled 2015-05-02 (×2): qty 50

## 2015-05-02 MED ORDER — INSULIN ASPART 100 UNIT/ML ~~LOC~~ SOLN
0.0000 [IU] | Freq: Three times a day (TID) | SUBCUTANEOUS | Status: DC
  Administered 2015-05-02 – 2015-05-03 (×2): 3 [IU] via SUBCUTANEOUS
  Administered 2015-05-03: 2 [IU] via SUBCUTANEOUS
  Administered 2015-05-03 – 2015-05-04 (×3): 1 [IU] via SUBCUTANEOUS

## 2015-05-02 MED ORDER — SODIUM CHLORIDE 0.9 % IJ SOLN: 3.0000 mL | INTRAMUSCULAR | Status: DC | PRN

## 2015-05-02 MED ORDER — OXYCODONE-ACETAMINOPHEN 5-325 MG PO TABS
1.0000 | ORAL_TABLET | ORAL | Status: DC | PRN
  Administered 2015-05-02: 1 via ORAL
  Filled 2015-05-02: qty 1

## 2015-05-02 MED ORDER — SODIUM CHLORIDE 0.9 % IR SOLN
Status: DC | PRN
  Administered 2015-05-02: 17:00:00

## 2015-05-02 MED ORDER — BUPIVACAINE HCL (PF) 0.5 % IJ SOLN
INTRAMUSCULAR | Status: DC | PRN
Start: 2015-05-02 — End: 2015-05-02
  Administered 2015-05-02: 5 mL

## 2015-05-02 MED ORDER — ACETAMINOPHEN 325 MG PO TABS: 650.0000 mg | ORAL_TABLET | ORAL | Status: DC | PRN

## 2015-05-02 MED ORDER — SUGAMMADEX SODIUM 200 MG/2ML IV SOLN
INTRAVENOUS | Status: AC
  Filled 2015-05-02: qty 2

## 2015-05-02 MED ORDER — FENTANYL CITRATE (PF) 100 MCG/2ML IJ SOLN
INTRAMUSCULAR | Status: DC | PRN
  Administered 2015-05-02: 50 ug via INTRAVENOUS
  Administered 2015-05-02: 150 ug via INTRAVENOUS
  Administered 2015-05-02: 50 ug via INTRAVENOUS

## 2015-05-02 MED ORDER — HYDROMORPHONE HCL 1 MG/ML IJ SOLN: 0.5000 mg | INTRAMUSCULAR | Status: DC | PRN

## 2015-05-02 MED ORDER — SODIUM CHLORIDE 0.9 % IV SOLN
250.0000 mL | INTRAVENOUS | Status: DC
  Administered 2015-05-03: 250 mL via INTRAVENOUS

## 2015-05-02 MED ORDER — PANTOPRAZOLE SODIUM 40 MG PO TBEC
80.0000 mg | DELAYED_RELEASE_TABLET | Freq: Every day | ORAL | Status: DC
  Administered 2015-05-03 – 2015-05-04 (×2): 80 mg via ORAL
  Filled 2015-05-02 (×3): qty 2

## 2015-05-02 MED ORDER — POLYETHYLENE GLYCOL 3350 17 G PO PACK
17.0000 g | PACK | Freq: Every day | ORAL | Status: DC | PRN
  Administered 2015-05-04: 17 g via ORAL
  Filled 2015-05-02: qty 1

## 2015-05-02 SURGICAL SUPPLY — 52 items
BAG DECANTER FOR FLEXI CONT (MISCELLANEOUS) ×2 IMPLANT
BLADE CLIPPER SURG (BLADE) ×2 IMPLANT
BUR ACORN 6.0 (BURR) IMPLANT
BUR MATCHSTICK NEURO 3.0 LAGG (BURR) ×2 IMPLANT
CANISTER SUCT 3000ML PPV (MISCELLANEOUS) ×2 IMPLANT
DECANTER SPIKE VIAL GLASS SM (MISCELLANEOUS) ×2 IMPLANT
DERMABOND ADVANCED (GAUZE/BANDAGES/DRESSINGS) ×1
DERMABOND ADVANCED .7 DNX12 (GAUZE/BANDAGES/DRESSINGS) ×1 IMPLANT
DRAPE LAPAROTOMY 100X72X124 (DRAPES) ×2 IMPLANT
DRAPE MICROSCOPE LEICA (MISCELLANEOUS) IMPLANT
DRAPE POUCH INSTRU U-SHP 10X18 (DRAPES) ×2 IMPLANT
DRAPE PROXIMA HALF (DRAPES) IMPLANT
DURAPREP 26ML APPLICATOR (WOUND CARE) ×2 IMPLANT
ELECT REM PT RETURN 9FT ADLT (ELECTROSURGICAL) ×2
ELECTRODE REM PT RTRN 9FT ADLT (ELECTROSURGICAL) ×1 IMPLANT
GAUZE SPONGE 4X4 12PLY STRL (GAUZE/BANDAGES/DRESSINGS) IMPLANT
GAUZE SPONGE 4X4 16PLY XRAY LF (GAUZE/BANDAGES/DRESSINGS) IMPLANT
GLOVE BIO SURGEON STRL SZ 6.5 (GLOVE) ×4 IMPLANT
GLOVE BIOGEL PI IND STRL 6.5 (GLOVE) ×2 IMPLANT
GLOVE BIOGEL PI IND STRL 7.5 (GLOVE) ×1 IMPLANT
GLOVE BIOGEL PI IND STRL 8.5 (GLOVE) ×1 IMPLANT
GLOVE BIOGEL PI INDICATOR 6.5 (GLOVE) ×2
GLOVE BIOGEL PI INDICATOR 7.5 (GLOVE) ×1
GLOVE BIOGEL PI INDICATOR 8.5 (GLOVE) ×1
GLOVE ECLIPSE 7.0 STRL STRAW (GLOVE) ×2 IMPLANT
GLOVE ECLIPSE 8.5 STRL (GLOVE) ×2 IMPLANT
GLOVE EXAM NITRILE LRG STRL (GLOVE) IMPLANT
GLOVE EXAM NITRILE MD LF STRL (GLOVE) IMPLANT
GLOVE EXAM NITRILE XL STR (GLOVE) IMPLANT
GLOVE EXAM NITRILE XS STR PU (GLOVE) IMPLANT
GOWN STRL REUS W/ TWL LRG LVL3 (GOWN DISPOSABLE) ×2 IMPLANT
GOWN STRL REUS W/ TWL XL LVL3 (GOWN DISPOSABLE) IMPLANT
GOWN STRL REUS W/TWL 2XL LVL3 (GOWN DISPOSABLE) ×2 IMPLANT
GOWN STRL REUS W/TWL LRG LVL3 (GOWN DISPOSABLE) ×2
GOWN STRL REUS W/TWL XL LVL3 (GOWN DISPOSABLE)
KIT BASIN OR (CUSTOM PROCEDURE TRAY) ×2 IMPLANT
KIT ROOM TURNOVER OR (KITS) ×2 IMPLANT
NEEDLE HYPO 22GX1.5 SAFETY (NEEDLE) ×2 IMPLANT
NEEDLE SPNL 20GX3.5 QUINCKE YW (NEEDLE) ×2 IMPLANT
NS IRRIG 1000ML POUR BTL (IV SOLUTION) ×2 IMPLANT
PACK LAMINECTOMY NEURO (CUSTOM PROCEDURE TRAY) ×2 IMPLANT
PAD ARMBOARD 7.5X6 YLW CONV (MISCELLANEOUS) ×6 IMPLANT
PATTIES SURGICAL .5 X1 (DISPOSABLE) ×2 IMPLANT
RUBBERBAND STERILE (MISCELLANEOUS) IMPLANT
SPONGE SURGIFOAM ABS GEL SZ50 (HEMOSTASIS) ×2 IMPLANT
SUT VIC AB 1 CT1 18XBRD ANBCTR (SUTURE) ×1 IMPLANT
SUT VIC AB 1 CT1 8-18 (SUTURE) ×1
SUT VIC AB 2-0 CP2 18 (SUTURE) ×2 IMPLANT
SUT VIC AB 3-0 SH 8-18 (SUTURE) ×2 IMPLANT
TOWEL OR 17X24 6PK STRL BLUE (TOWEL DISPOSABLE) ×2 IMPLANT
TOWEL OR 17X26 10 PK STRL BLUE (TOWEL DISPOSABLE) ×2 IMPLANT
WATER STERILE IRR 1000ML POUR (IV SOLUTION) ×2 IMPLANT

## 2015-05-02 NOTE — Op Note (Signed)
Date of surgery: 05/02/2015 Preoperative diagnosis: Lumbar spinal stenosis L4-5 with left lumbar spinal stenosis L5-S1 for S1 nerve root, lumbar radiculopathy, neurogenic claudication Postoperative diagnosis: Lumbar spinal stenosis L4-5 with left lumbar spinal stenosis L5-S1 for lumbar nerve root, lumbar radiculopathy, neurogenic claudication Procedure: Bilateral laminotomies and decompression of L4 and L5 nerve roots, left sided L5-S1 laminotomy and foraminotomies decompression of S1 nerve root Surgeon: Barnett Abu First assistant:Nundkumar M.D. Anesthesia: Gen. endotracheal Indications: Amendola bariatric is a 74 year old individual who has had severe stenosis at L4-L5 with neurogenic claudication and lumbar radicular pain for a number of months. He was found to have severe stenosis at L4-5 on a recent MRI he also has evidence of compression of the S1 nerve root on the left side and he has both symptoms of the neurogenic claudication and lumbar radiculopathy that he's been suffering with. After evaluation the office he was advised regarding surgical decompression of L4-5 and L5-S1 on the left.  Procedure: Patient was brought to the operating room supine on a stretcher. After the smooth induction of general endotracheal anesthesia, he was turned prone. The back was shaved prepped with alcohol and DuraPrep and draped in a sterile fashion. A midline incision was created and carried down to the lumbar dorsal fascia. A localizing radiograph was obtained identifying positively the L4-5 space. Then laminotomies were created bilaterally at L4-L5 using a high-speed bur and a 2 mm matchstick tool to remove the inferior marginal lamina of L4 out to and including the medial wall the facet. Partial mesial facetectomy was performed in the thickened redundant yellow ligament and this region was lifted underlying this dura was identified and was noted to be markedly compressed by a significant mass of the redundant  ligament. I carefully dissecting between the dura and the ligament more ligament was elevated and this allowed for decompression of the common dural tube. As the laminotomy was widened the path of the L5 nerve root was identified in this area was decompressed using a 2 and 3 mm Kerrison punch and a high-speed drill to thin the bone above. Once this was accomplished the path of the L4 nerve root was dissected superiorly using some curved Kerrison punches to remove redundant yellow ligament in this region. This procedure was then carried out on the opposite side at L4-L5.  Next attention was turned to the L5-S1 zone on the left side. A laminotomy was carried out here in the common dural tube was identified after lifting the yellow ligament. Partial mesial facetectomy was performed at the L5-S1 level and this allowed for visualization of the take off of the S1 nerve root which was bowed over a calcified Harden disc in the lateral recess. By undercutting the superior articular process of S1 was able to free the S1 nerve root and gradually mobilized medially. The mass underneath was noted to be hard and calcified and was not opened at this point by doing the dorsal decompression of the S1 nerve root was free and clear the path towards the foramen. At this point hemostasis and the soft tissues was obtained meticulously and when verified retractor was removed the lumbar dorsal fascia was closed with #1 Vicryls interrupted fashion 20 Vicryls used in the subcutaneous tissues, 30 Vicryls subcuticularly. Dermabond was used on the skin. Patient tolerated procedure well blood loss was estimated at less than 50 mL.

## 2015-05-02 NOTE — Progress Notes (Signed)
Patient ID: Paul Villarreal, male   DOB: [birth year], 74 y.o.   MRN: [id number] Vital signs are stable Patient feels well Not having significant amounts of pain Motor function appears good in lower extremities Stable postop

## 2015-05-02 NOTE — Anesthesia Procedure Notes (Signed)
Procedure Name: Intubation Date/Time: 05/02/2015 3:47 PM Performed by: Glo Herring B Pre-anesthesia Checklist: Patient identified, Emergency Drugs available, Suction available, Patient being monitored and Timeout performed Patient Re-evaluated:Patient Re-evaluated prior to inductionOxygen Delivery Method: Circle system utilized and Simple face mask Preoxygenation: Pre-oxygenation with 100% oxygen Intubation Type: IV induction Ventilation: Mask ventilation without difficulty Laryngoscope Size: Mac and 4 Grade View: Grade I Tube type: Oral Tube size: 7.5 mm Number of attempts: 1 Airway Equipment and Method: Patient positioned with wedge pillow and Stylet Placement Confirmation: ETT inserted through vocal cords under direct vision,  positive ETCO2 and breath sounds checked- equal and bilateral Secured at: 23 cm Tube secured with: Tape Dental Injury: Teeth and Oropharynx as per pre-operative assessment  Comments: Performed by Irving Burton, CRNA

## 2015-05-02 NOTE — Transfer of Care (Signed)
Immediate Anesthesia Transfer of Care Note  Patient: Paul Villarreal  Procedure(s) Performed: Procedure(s) with comments: Bilateral Lumbar four five, Left  Lumbar five Sacral one Laminectomy (Left) - Left L4-5 L5-S1 Laminectomy  Patient Location: PACU  Anesthesia Type:General  Level of Consciousness: awake and alert   Airway & Oxygen Therapy: Patient Spontanous Breathing and Patient connected to nasal cannula oxygen  Post-op Assessment: Report given to RN, Post -op Vital signs reviewed and stable and Patient moving all extremities X 4  Post vital signs: Reviewed and stable  Last Vitals:  Filed Vitals:   05/02/15 1730  BP:   Pulse: 79  Temp:   Resp: 15    Complications: No apparent anesthesia complications

## 2015-05-02 NOTE — H&P (Signed)
Paul Villarreal is an 74 y.o. male.   Chief Complaint: Back and leg pain limited ability to walk HPI: Patient is a 74 year old individual is had significant centralized low back pain with radiation to both lower extremities. He's had limited ability to walk and MRIs demonstrate patient has a severe stenosis at L4-L5 with some modest disc degenerative changes he also has a left-sided stenosis for the L5 nerve root L5-S1. He's been advised regarding surgery to decompress this region and effort to overcome some of the pain improve his functional status in regards to his gait.  Past Medical History  Diagnosis Date  . Diabetes mellitus without complication   . Hyperlipemia   . Heart disease   . GERD (gastroesophageal reflux disease)   . Degenerative lumbar spinal stenosis   . Coronary artery disease   . Iron deficiency   . Decreased hearing   . Lower extremity pain   . Nonintractable chronic migraine   . Myocardial infarction   . Arthritis   . Anemia     Past Surgical History  Procedure Laterality Date  . Coronary artery bypass graft      2006  . Cardiac catheterization      2006    Family History  Problem Relation Age of Onset  . Stroke Father    Social History:  reports that he has never smoked. He does not have any smokeless tobacco history on file. He reports that he does not drink alcohol or use illicit drugs.  Allergies: No Known Allergies  No prescriptions prior to admission    No results found for this or any previous visit (from the past 48 hour(s)). No results found.  Review of Systems  HENT: Negative.   Eyes: Negative.   Respiratory: Negative.   Cardiovascular:       History of significant cardiac disease  Gastrointestinal: Negative.   Genitourinary: Negative.   Musculoskeletal: Positive for back pain.  Skin: Negative.   Neurological: Positive for sensory change and weakness.  Endo/Heme/Allergies: Negative.   Psychiatric/Behavioral: Negative.      There were no vitals taken for this visit. Physical Exam  Constitutional: He is oriented to person, place, and time. He appears well-developed and well-nourished.  HENT:  Head: Normocephalic and atraumatic.  Eyes: Conjunctivae and EOM are normal. Pupils are equal, round, and reactive to light.  Neck: Normal range of motion. Neck supple.  Cardiovascular: Normal rate and regular rhythm.   Respiratory: Effort normal and breath sounds normal.  GI: Soft. Bowel sounds are normal.  Neurological: He is alert and oriented to person, place, and time.  Modest weakness in all groups of the lower extremities in iliopsoas quadriceps tibialis anterior and gastrocs. Absent reflexes in the patellae and Achilles  Skin: Skin is warm and dry.  Psychiatric: He has a normal mood and affect. His behavior is normal. Judgment and thought content normal.     Assessment/Plan Lumbar stenosis L4-5 L5-S1 left, neurogenic claudication, lumbar radiculopathy.  Decompression via laminectomy L4-5. Decompression L5-S1 left.  Paul Villarreal 05/02/2015, 7:54 AM

## 2015-05-02 NOTE — Anesthesia Preprocedure Evaluation (Signed)
Anesthesia Evaluation  Patient identified by MRN, date of birth, ID band Patient awake    Reviewed: Allergy & Precautions, NPO status , Patient's Chart, lab work & pertinent test results  Airway Mallampati: I  TM Distance: >3 FB Neck ROM: Full    Dental   Pulmonary    Pulmonary exam normal        Cardiovascular + CAD, + Past MI and + CABG  Normal cardiovascular exam     Neuro/Psych    GI/Hepatic GERD  Medicated and Controlled,  Endo/Other  diabetes, Well Controlled, Type 2, Oral Hypoglycemic Agents  Renal/GU      Musculoskeletal   Abdominal   Peds  Hematology   Anesthesia Other Findings   Reproductive/Obstetrics                             Anesthesia Physical Anesthesia Plan  ASA: III  Anesthesia Plan: General   Post-op Pain Management:    Induction: Intravenous  Airway Management Planned: Oral ETT  Additional Equipment:   Intra-op Plan:   Post-operative Plan: Extubation in OR  Informed Consent: I have reviewed the patients History and Physical, chart, labs and discussed the procedure including the risks, benefits and alternatives for the proposed anesthesia with the patient or authorized representative who has indicated his/her understanding and acceptance.     Plan Discussed with: CRNA and Surgeon  Anesthesia Plan Comments:         Anesthesia Quick Evaluation

## 2015-05-03 ENCOUNTER — Encounter (HOSPITAL_COMMUNITY): Payer: Self-pay | Admitting: Neurological Surgery

## 2015-05-03 LAB — GLUCOSE, CAPILLARY
GLUCOSE-CAPILLARY: 216 mg/dL — AB (ref 65–99)
GLUCOSE-CAPILLARY: 131 mg/dL — AB (ref 65–99)
GLUCOSE-CAPILLARY: 123 mg/dL — AB (ref 65–99)
Glucose-Capillary: 87 mg/dL (ref 65–99)
Glucose-Capillary: 187 mg/dL — ABNORMAL HIGH (ref 65–99)

## 2015-05-03 NOTE — Progress Notes (Signed)
RN used hospital translator to explain medications to pt.  Pt agreed to take the medications after talking to the translator.   Estanislado Emms, RN

## 2015-05-03 NOTE — Anesthesia Postprocedure Evaluation (Signed)
Anesthesia Post Note  Patient: Paul Villarreal  Procedure(s) Performed: Procedure(s) (LRB): Bilateral Lumbar four five, Left  Lumbar five Sacral one Laminectomy (Left)  Anesthesia type: General  Patient location: PACU  Post pain: Pain level controlled  Post assessment: Post-op Vital signs reviewed  Last Vitals: BP 103/48 mmHg  Pulse 67  Temp(Src) 36.8 C (Oral)  Resp 20  Ht  (1.626 m)  Wt 157 lb (71.215 kg)  BMI 26.94 kg/m2  SpO2 99%  Post vital signs: Reviewed  Level of consciousness: sedated  Complications: No apparent anesthesia complications

## 2015-05-03 NOTE — Evaluation (Signed)
Physical Therapy Evaluation Patient Details Name: Paul Villarreal MRN: [id number] DOB: [birth year] Today's Date: 05/03/2015   History of Present Illness  Bilateral laminotomies and decompression of L4 and L5 nerve roots, left sided L5-S1 laminotomy and foraminotomies decompression of S1 nerve root  Clinical Impression  Patient is s/p above surgery resulting in functional limitations due to the deficits listed below (see PT Problem List). Ambulates generally well, slightly guarded but stable without need for physical assist. Family in room and very supportive. Safely completed stair training today. Patient will benefit from skilled PT to increase their independence and safety with mobility to allow discharge to the venue listed below.       Follow Up Recommendations No PT follow up;Supervision for mobility/OOB    Equipment Recommendations  None recommended by PT    Recommendations for Other Services       Precautions / Restrictions Precautions Precautions: Back Precaution Booklet Issued: Yes (comment) Precaution Comments: Reviewed precautions Restrictions Weight Bearing Restrictions: No      Mobility  Bed Mobility Overal bed mobility: Needs Assistance Bed Mobility: Rolling;Sidelying to Sit;Sit to Sidelying Rolling: Supervision Sidelying to sit: Supervision     Sit to sidelying: Supervision General bed mobility comments: Supervision with cues for technique during log roll. Slow to rise but maintains neutral spine alignment throughout transitions.  Transfers Overall transfer level: Needs assistance Equipment used: None Transfers: Sit to/from Stand Sit to Stand: Supervision         General transfer comment: Slow to rise but stable. VC for hand placement when reaching back for chair to sit. No buckling noted.  Ambulation/Gait Ambulation/Gait assistance: Supervision Ambulation Distance (Feet): 250 Feet Assistive device: None Gait Pattern/deviations: Step-through  pattern;Decreased stride length Gait velocity: decreased Gait velocity interpretation: Below normal speed for age/gender General Gait Details: Slightly guarded but stable. No loss of balance, able to step backwards and turn without need for assist or assistive device for balance.   Stairs Stairs: Yes Stairs assistance: Min guard Stair Management: No rails;Step to pattern;Forwards Number of Stairs: 2 (x2) General stair comments: First attempt with hand held support, second attempt without physical assist at min guard level for safety. VC for sequencing. No overt loss of balance. Pt and family state they feel comfortable with this task.  Wheelchair Mobility    Modified Rankin (Stroke Patients Only)       Balance Overall balance assessment: Needs assistance Sitting-balance support: No upper extremity supported Sitting balance-Leahy Scale: Good     Standing balance support: No upper extremity supported Standing balance-Leahy Scale: Good Standing balance comment: improved as pt became more mobile                             Pertinent Vitals/Pain Pain Assessment: No/denies pain Pain Score: 0-No pain    Home Living Family/patient expects to be discharged to:: Private residence Living Arrangements: Spouse/significant other;Children Available Help at Discharge: Family;Available 24 hours/day Type of Home: House Home Access: Stairs to enter Entrance Stairs-Rails: None Entrance Stairs-Number of Steps: 1 Home Layout: Two level;Able to live on main level with bedroom/bathroom Home Equipment: None      Prior Function Level of Independence: Independent               Hand Dominance        Extremity/Trunk Assessment   Upper Extremity Assessment: Defer to OT evaluation           Lower Extremity Assessment: Overall WFL for  tasks assessed      Cervical / Trunk Assessment: Normal  Communication   Communication: Prefers language other than English   Cognition Arousal/Alertness: Awake/alert Behavior During Therapy: WFL for tasks assessed/performed Overall Cognitive Status: Within Functional Limits for tasks assessed                      General Comments General comments (skin integrity, edema, etc.): Discussed safety with mobility with pt and family.    Exercises        Assessment/Plan    PT Assessment Patient needs continued PT services  PT Diagnosis Abnormality of gait   PT Problem List Decreased balance;Decreased activity tolerance;Decreased mobility;Decreased knowledge of precautions  PT Treatment Interventions DME instruction;Gait training;Stair training;Functional mobility training;Therapeutic activities;Therapeutic exercise;Balance training;Neuromuscular re-education;Patient/family education   PT Goals (Current goals can be found in the Care Plan section) Acute Rehab PT Goals Patient Stated Goal: to go home PT Goal Formulation: With patient/family Time For Goal Achievement: 05/17/15 Potential to Achieve Goals: Good    Frequency Min 5X/week   Barriers to discharge        Co-evaluation               End of Session   Activity Tolerance: Patient tolerated treatment well;No increased pain Patient left: in chair;with call bell/phone within reach;with family/visitor present Nurse Communication: Mobility status         Time: 1610-9604 PT Time Calculation (min) (ACUTE ONLY): 16 min   Charges:   PT Evaluation $Initial PT Evaluation Tier I: 1 Procedure     PT G CodesBerton Mount 05/03/2015, 12:52 PM Sunday Spillers [place], [place] [phone]

## 2015-05-03 NOTE — Progress Notes (Signed)
Vital signs are stable Patient's dressing is dry and intact He is feeling no pain in his legs and not much in his back He is voiding well We'll continue to progress with physical therapy.

## 2015-05-03 NOTE — Progress Notes (Signed)
RN assessed pt pain level throughout the night and pt refused to take his scheduled Toradol stating "I have no pain".  Will continue to monitor.   Estanislado Emms, RN

## 2015-05-03 NOTE — Clinical Social Work Note (Signed)
CSW Consult Acknowledged:   CSW received a consult for SNF placement.  Per PT/OT's evaluation the appropriate level of care home. CSW will sign off.       Dysheka Bibbs, MSW, LCSWA [phone]

## 2015-05-03 NOTE — Progress Notes (Addendum)
Occupational Therapy Evaluation Patient Details Name: Paul Villarreal MRN: [id number] DOB: 09-06-1940 Today's Date: 05/03/2015    History of Present Illness Bilateral laminotomies and decompression of L4 and L5 nerve roots, left sided L5-S1 laminotomy and foraminotomies decompression of S1 nerve root   Clinical Impression   Family interpreted during session. Interpreter waiver signed and placed in chart. Pt making excellent progress.  Family present for education regarding back precautions for ADL and functional mobility for ADL.  Requesting therapist return in am to review how to assist pt after D/C. Will follow to address established goals.     Follow Up Recommendations  No OT follow up;Supervision/Assistance - 24 hour (initially)    Equipment Recommendations  Tub/shower seat    Recommendations for Other Services       Precautions / Restrictions Precautions Precautions: Back Precaution Booklet Issued: Yes (comment)      Mobility Bed Mobility Overal bed mobility: Needs Assistance Bed Mobility: Rolling;Sidelying to Sit Rolling: Supervision Sidelying to sit: Min guard       General bed mobility comments: vc for technique. Pt using rails  Transfers Overall transfer level: Needs assistance   Transfers: Sit to/from Stand Sit to Stand: Min guard         General transfer comment: Cues for upright posture.     Balance Overall balance assessment: Needs assistance   Sitting balance-Leahy Scale: Good       Standing balance-Leahy Scale: Fair Standing balance comment: improved as pt became more mobile                            ADL Overall ADL's : Needs assistance/impaired     Grooming: Set up;Supervision/safety;Standing   Upper Body Bathing: Supervision/ safety;Set up;Sitting   Lower Body Bathing: Minimal assistance;Sit to/from stand   Upper Body Dressing : Supervision/safety;Set up;Sitting   Lower Body Dressing: Minimal assistance;Sit  to/from stand Lower Body Dressing Details (indicate cue type and reason): able to cross legs over Toilet Transfer: Min guard;Ambulation;Regular Toilet   Toileting- Architect and Hygiene: Supervision/safety;Set up;Sit to/from stand       Functional mobility during ADLs: Minimal assistance General ADL Comments: Pt/family educated on back precautions regarding ADL and functional mobility. Recommend pt to initially use a hsower seat to reduce risk of falls. Pt/family requesting therapist reutrn in am to review ADL and how to assist pt again in the am. Pt would benefit from use of RW.     Vision     Perception     Praxis      Pertinent Vitals/Pain Pain Assessment: 0-10 Pain Score: 0-No pain     Hand Dominance     Extremity/Trunk Assessment Upper Extremity Assessment Upper Extremity Assessment: Overall WFL for tasks assessed   Lower Extremity Assessment Lower Extremity Assessment: Defer to PT evaluation   Cervical / Trunk Assessment Cervical / Trunk Assessment: Normal   Communication Communication Communication: Prefers language other than English   Cognition Arousal/Alertness: Awake/alert Behavior During Therapy: WFL for tasks assessed/performed Overall Cognitive Status: Within Functional Limits for tasks assessed                     General Comments       Exercises       Shoulder Instructions      Home Living Family/patient expects to be discharged to:: Private residence Living Arrangements: Spouse/significant other;Children Available Help at Discharge: Family;Available 24 hours/day Type of Home: House Home Access: Stairs  to enter Entrance Stairs-Number of Steps: 1 Entrance Stairs-Rails: None Home Layout: Two level;Able to live on main level with bedroom/bathroom     Bathroom Shower/Tub: Producer, television/film/video: Standard Bathroom Accessibility: Yes How Accessible: Accessible via walker Home Equipment: None           Prior Functioning/Environment Level of Independence: Independent             OT Diagnosis: Acute pain   OT Problem List: Decreased activity tolerance;Decreased safety awareness;Decreased knowledge of use of DME or AE;Decreased knowledge of precautions;Pain   OT Treatment/Interventions: Self-care/ADL training;DME and/or AE instruction;Therapeutic activities;Patient/family education    OT Goals(Current goals can be found in the care plan section) Acute Rehab OT Goals Patient Stated Goal: to go home OT Goal Formulation: With patient Time For Goal Achievement: 05/10/15 Potential to Achieve Goals: Good ADL Goals Pt Will Perform Lower Body Bathing: with set-up;with supervision;with caregiver independent in assisting;sit to/from stand Pt Will Perform Lower Body Dressing: with set-up;with supervision;with caregiver independent in assisting;sit to/from stand Pt Will Transfer to Toilet: with supervision;ambulating;regular height toilet (with caregiver assisting) Pt Will Perform Toileting - Clothing Manipulation and hygiene: with supervision;sit to/from stand;with caregiver independent in assisting (adhering to back precautions) Additional ADL Goal #1: Pt will verbalize understanding of use of back precatuions for ADL and recommeded useof shower chair  to reduce fall risk  OT Frequency: Min 2X/week   Barriers to D/C:            Co-evaluation              End of Session Equipment Utilized During Treatment: Gait belt Nurse Communication: Mobility status;Precautions  Activity Tolerance: Patient tolerated treatment well Patient left: in bed;Other (comment) (with PT)   Time: 1115-1150 OT Time Calculation (min): 35 min Charges:  OT General Charges $OT Visit: 1 Procedure OT Evaluation $Initial OT Evaluation Tier I: 1 Procedure OT Treatments $Self Care/Home Management : 8-22 mins G-Codes:    WARD,HILLARY 05/20/2015, 12:07 PM   Hilary Ward, OTR/L  [phone] 05/20/15

## 2015-05-03 NOTE — Care Management Note (Signed)
Case Management Note  Patient Details  Name: Zaydan Papesh MRN: [id number] Date of Birth: September 17, 1940  Subjective/Objective:                    Action/Plan: Patient admitted with lumbar stenosis. Patient had a L4-S1 Laminectomy. Patient is from home with spouse. CM will continue to follow for discharge needs.  Expected Discharge Date:   (Pending)               Expected Discharge Plan:  Home/Self Care  In-House Referral:     Discharge planning Services     Post Acute Care Choice:    Choice offered to:     DME Arranged:    DME Agency:     HH Arranged:    HH Agency:     Status of Service:  In process, will continue to follow  Medicare Important Message Given:    Date Medicare IM Given:    Medicare IM give by:    Date Additional Medicare IM Given:    Additional Medicare Important Message give by:     If discussed at Long Length of Stay Meetings, dates discussed:    Additional Comments:  Kermit Balo, RN 05/03/2015, 2:14 PM

## 2015-05-04 ENCOUNTER — Ambulatory Visit: Payer: Medicaid Other | Admitting: Neurology

## 2015-05-04 LAB — GLUCOSE, CAPILLARY
Glucose-Capillary: 125 mg/dL — ABNORMAL HIGH (ref 65–99)
Glucose-Capillary: 134 mg/dL — ABNORMAL HIGH (ref 65–99)

## 2015-05-04 MED ORDER — OXYCODONE-ACETAMINOPHEN 5-325 MG PO TABS: 1.0000 | ORAL_TABLET | ORAL | Status: DC | PRN

## 2015-05-04 MED ORDER — METHOCARBAMOL 500 MG PO TABS: 500.0000 mg | ORAL_TABLET | Freq: Four times a day (QID) | ORAL | Status: DC | PRN

## 2015-05-04 NOTE — Progress Notes (Signed)
Physical Therapy Treatment Patient Details Name: Paul Villarreal MRN: [id number] DOB: Dec 15, 1940 Today's Date: 05/04/2015    History of Present Illness Bilateral laminotomies and decompression of L4 and L5 nerve roots, left sided L5-S1 laminotomy and foraminotomies decompression of S1 nerve root    PT Comments    Continues to make strong progress with functional mobility. Ascends/descends steps without physical assistance. Tolerated more dynamic gait challenges today without loss of balance. Maintains back precautions at all times. Adequate for d/c from PT standpoint.  Follow Up Recommendations  No PT follow up;Supervision for mobility/OOB     Equipment Recommendations  None recommended by PT    Recommendations for Other Services       Precautions / Restrictions Precautions Precautions: Back Precaution Comments: Maintains precautions at all times. Restrictions Weight Bearing Restrictions: No    Mobility  Bed Mobility               General bed mobility comments: in recliner  Transfers Overall transfer level: Modified independent Equipment used: None             General transfer comment: Extra time to sit<>stand transition from recliner. No assist and maintains back precautions  Ambulation/Gait Ambulation/Gait assistance: Modified independent (Device/Increase time) Ambulation Distance (Feet): 275 Feet Assistive device: None Gait Pattern/deviations: Step-through pattern     General Gait Details: Still somewhat guarded but very stable without loss of balance. Progressed dynamic gait challenges with side stepping activity and backwards stepping; both performed without loss of balance.   Stairs Stairs: Yes Stairs assistance: Modified independent (Device/Increase time) Stair Management: No rails;Step to pattern;Forwards Number of Stairs: 2 General stair comments: Performed without physical assist. Slow but steady and no need for rails.  Wheelchair  Mobility    Modified Rankin (Stroke Patients Only)       Balance                                    Cognition Arousal/Alertness: Awake/alert Behavior During Therapy: WFL for tasks assessed/performed Overall Cognitive Status: Within Functional Limits for tasks assessed                      Exercises General Exercises - Lower Extremity Ankle Circles/Pumps: AROM;Both;20 reps;Seated Gluteal Sets: Strengthening;Both;10 reps;Seated Long Arc Quad: Strengthening;Both;20 reps;Seated Hip Flexion/Marching: Strengthening;Both;10 reps;Seated    General Comments        Pertinent Vitals/Pain Pain Assessment: No/denies pain    Home Living                      Prior Function            PT Goals (current goals can now be found in the care plan section) Acute Rehab PT Goals Patient Stated Goal: to go home PT Goal Formulation: With patient/family Time For Goal Achievement: 05/17/15 Potential to Achieve Goals: Good Progress towards PT goals: Progressing toward goals    Frequency  Min 5X/week    PT Plan Current plan remains appropriate    Co-evaluation             End of Session   Activity Tolerance: Patient tolerated treatment well;No increased pain Patient left: in chair;with call bell/phone within reach     Time: 1914-7829 PT Time Calculation (min) (ACUTE ONLY): 14 min  Charges:  $Gait Training: 8-22 mins  G Codes:      Paul Villarreal 11-May-2015, 12:40 PM  Paul Villarreal [place], [place] [phone]

## 2015-05-04 NOTE — Discharge Summary (Signed)
Physician Discharge Summary  Patient ID: Paul Villarreal MRN: [id number] DOB/AGE: July 16, 1941 74 y.o.  Admit date: 05/02/2015 Discharge date: 05/04/2015  Admission Diagnoses: Lumbar spinal stenosis L4-5 left lumbar radiculopathy left L5-S1 stenosis secondary to chronic herniated nucleus pulposus L5-S1  Discharge Diagnoses: Lumbar spinal stenosis L4-5, left lumbar radiculopathy L5-S1 stenosis secondary to chronic herniated nucleus pulposus L5-S1  Active Problems:   Lumbar stenosis   Discharged Condition: good  Hospital Course: Patient underwent surgery and tolerated it well. He is ambulatory.  Consults: None  Significant Diagnostic Studies: None  Treatments: surgery: Laminotomies L4-5 bilaterally with left L5-S1 laminotomy and foraminotomies  Discharge Exam: Blood pressure 115/51, pulse 74, temperature 99.3 F (37.4 C), temperature source Oral, resp. rate 18, height 5' 4"  (1.626 m), weight 71.215 kg (157 lb), SpO2 99 %. Incision is clean and dry. Motor function is intact in lower extremities. Station and gait are intact  Disposition: Discharge home  Discharge Instructions    Call MD for:  redness, tenderness, or signs of infection (pain, swelling, redness, odor or green/yellow discharge around incision site)    Complete by:  As directed      Call MD for:  severe uncontrolled pain    Complete by:  As directed      Call MD for:  temperature >100.4    Complete by:  As directed      Diet - low sodium heart healthy    Complete by:  As directed      Discharge instructions    Complete by:  As directed   Okay to shower. Do not apply salves or appointments to incision. No heavy lifting with the upper extremities greater than 15 pounds. May resume driving when not requiring pain medication and patient feels comfortable with doing so.     Increase activity slowly    Complete by:  As directed             Medication List    TAKE these medications        aspirin EC 81 MG tablet   Take 81 mg by mouth daily.     Blood Glucose Monitoring Suppl Kit  by Does not apply route.     CRESTOR 10 MG tablet  Generic drug:  rosuvastatin  Take 10 mg by mouth at bedtime.     FISH OIL PO  Take 1 capsule by mouth daily.     glucose blood test strip  1 each by Other route as needed for other. Use as instructed     ACCU-CHEK AVIVA PLUS VI  1 strip by In Vitro route daily.     glyBURIDE 2.5 MG tablet  Commonly known as:  DIABETA  Take 1.25 mg by mouth every other day.     metFORMIN 500 MG tablet  Commonly known as:  GLUCOPHAGE  Take 500 mg by mouth 2 (two) times daily with a meal.     methocarbamol 500 MG tablet  Commonly known as:  ROBAXIN  Take 1 tablet (500 mg total) by mouth every 6 (six) hours as needed for muscle spasms.     multivitamin 3-35-2 MG Tabs tablet  Take 1 tablet by mouth daily.     multivitamin with minerals Tabs tablet  Take 1 tablet by mouth daily. Centrum Silver     omeprazole 40 MG capsule  Commonly known as:  PRILOSEC  Take 40 mg by mouth daily.     oxyCODONE-acetaminophen 5-325 MG tablet  Commonly known as:  PERCOCET/ROXICET  Take 1-2  tablets by mouth every 4 (four) hours as needed for moderate pain.     Vitamin D (Ergocalciferol) 50000 UNITS Caps capsule  Commonly known as:  DRISDOL  Take 50,000 Units by mouth every 7 (seven) days. On Thursdays         Signed: Earleen Newport 05/04/2015, 9:38 AM

## 2015-05-04 NOTE — Progress Notes (Signed)
Discharge orders received.  Discharge instructions and follow-up appointments reviewed with the patient and his daughter.  VSS upon discharge.  IV removed and education complete.  Pt's family had some concerns regarding a lumbar brace.  This Clinical research associate explained to the patient's daughter that the patient had not been using a brace the whole time he was here, also there was no order for a brace nor was there any mention of one in the Dr's last note. This Clinical research associate called the Dr's office to clarify and was told that the Dr was away from the office for the day and that a message would be left for him.  I explained this to the daughter and let her know that it would be tomorrow before Dr Danielle Dess would get the message and that if the office did not call her then she should call the office in the morning.  Prescriptions and all belongings sent with the patient.Transported out via wheelchair, family present. Sondra Come, RN

## 2015-05-04 NOTE — Progress Notes (Signed)
Patient ID: Paul Villarreal, male   DOB: 08-Mar-1941, 74 y.o.   MRN: [id number] Vital signs are stable Motor function is good Incision is clean and dry Plan discharge today

## 2015-05-04 NOTE — Progress Notes (Addendum)
Occupational Therapy Treatment Patient Details Name: Paul Villarreal MRN: [id number] DOB: August 10, 1940 Today's Date: 05/04/2015    History of present illness Bilateral laminotomies and decompression of L4 and L5 nerve roots, left sided L5-S1 laminotomy and foraminotomies decompression of S1 nerve root   OT comments  Completed all education with pt regarding compensatory techniques for ADL. Pt able to return demonstrate. Pt ready to D/C home when medically stable. No further OT needed at this time.   Follow Up Recommendations  No OT follow up;Supervision/Assistance - Intermittent (24/7 initially)    Equipment Recommendations  Tub/shower seat (family to get)   Recommendations for Other Services      Precautions / Restrictions Precautions Precautions: Back Precaution Comments: Maintains precautions at all times. Restrictions Weight Bearing Restrictions: No       Mobility Bed Mobility Overal bed mobility: Needs Assistance     Sidelying to sit: Supervision (vc for technique)         Transfers Overall transfer level: Modified independent Equipment used: None             General transfer comment: Extra time to sit<>stand transition from recliner. No assist and maintains back precautions    Balance             Standing balance-Leahy Scale: Good Standing balance comment: improved as session progressed                   ADL                                       Functional mobility during ADLs: Modified independent (with shoes) General ADL Comments: completed ADL session with pt with pt able to return demonstrate useof back precautions. Pt able to return demonstrate all compensatory techniques.Pt did require vc to correctly complete bed mobility. Pt issued a reacher to retrieve items from floor.                                       Cognition   Behavior During Therapy: WFL for tasks assessed/performed Overall Cognitive  Status: Within Functional Limits for tasks assessed                       Extremity/Trunk Assessment               Exercises General Exercises - Lower Extremity Ankle Circles/Pumps: AROM;Both;20 reps;Seated Gluteal Sets: Strengthening;Both;10 reps;Seated Long Arc Quad: Strengthening;Both;20 reps;Seated Hip Flexion/Marching: Strengthening;Both;10 reps;Seated   Shoulder Instructions       General Comments      Pertinent Vitals/ Pain       Pain Assessment: Faces Faces Pain Scale: Hurts little more Pain Location: back Pain Descriptors / Indicators: Aching;Discomfort;Grimacing Pain Intervention(s): Limited activity within patient's tolerance;Monitored during session;Patient requesting pain meds-RN notified  Home Living                                          Prior Functioning/Environment              Frequency       Progress Toward Goals  OT Goals(current goals can now be found in the care plan section)  Progress towards OT goals: Goals met/education  completed, patient discharged from OT (adequate for D?C )  Acute Rehab OT Goals Patient Stated Goal: to go home OT Goal Formulation: With patient Time For Goal Achievement: 05/10/15 Potential to Achieve Goals: Good ADL Goals Pt Will Perform Lower Body Bathing: with set-up;with supervision;with caregiver independent in assisting;sit to/from stand Pt Will Perform Lower Body Dressing: with set-up;with supervision;with caregiver independent in assisting;sit to/from stand Pt Will Transfer to Toilet: with supervision;ambulating;regular height toilet Pt Will Perform Toileting - Clothing Manipulation and hygiene: with supervision;sit to/from stand;with caregiver independent in assisting Additional ADL Goal #1: Pt will verbalize understanding of use of back precatuions for ADL and recommeded useof shower chair  to reduce fall risk  Plan All goals met and education completed, patient discharged from  OT services    Co-evaluation                 End of Session Equipment Utilized During Treatment: Gait belt   Activity Tolerance Patient tolerated treatment well   Patient Left in chair;with call bell/phone within reach   Nurse Communication Mobility status;Patient requests pain meds        Time: 1035-1100 OT Time Calculation (min): 25 min  Charges: OT General Charges $OT Visit: 1 Procedure OT Treatments $Self Care/Home Management : 23-37 mins  Elford Evilsizer,HILLARY 05/04/2015, 1:03 PM   [facility], OTR/L  [phone] 05/04/2015

## 2015-05-04 NOTE — Care Management Note (Signed)
Case Management Note  Patient Details  Name: Paul Villarreal MRN: [id number] Date of Birth: [birth year]  Subjective/Objective:                    Action/Plan: Patient being discharged home with family. Patient has an order for home health PT per Dr Danielle Dess, but his Medicaid will not cover this. PT/OT recommendations are for supervision. CM spoke with patient's daughter at the bedside to go over that patient's medicaid would not cover [facility] services. Patient's daughter concerned about not getting home health. CM asked daughter if she would like private pay [facility] services and she refused. Daughter wanting to speak with PT/OT services to see their recommendations from today. CM went over with daughter the notes from PT/OT today but she insisted on speaking with PT/OT. PT/OT personnel that saw patient today notified.  Patient had order for DME: tub bench. Jermaine notified this am and tub bench delivered to the room and was present at time CM speaking with the patient. Will notify bedside RN.   Expected Discharge Date:   (Pending)               Expected Discharge Plan:  Home/Self Care  In-House Referral:     Discharge planning Services  CM Consult  Post Acute Care Choice:    Choice offered to:     DME Arranged:  Tub bench DME Agency:  [facility]  HH Arranged:    HH Agency:     Status of Service:  Completed, signed off  Medicare Important Message Given:    Date Medicare IM Given:    Medicare IM give by:    Date Additional Medicare IM Given:    Additional Medicare Important Message give by:     If discussed at Long Length of Stay Meetings, dates discussed:    Additional Comments:  Kermit Balo, RN 05/04/2015, 4:04 PM

## 2015-05-04 NOTE — Progress Notes (Signed)
Occupational Therapy Treatment Patient Details Name: Paul Villarreal MRN: [id number] DOB: 05-01-1941 Today's Date: 05/04/2015    History of present illness Bilateral laminotomies and decompression of L4 and L5 nerve roots, left sided L5-S1 laminotomy and foraminotomies decompression of S1 nerve root   OT comments  Pt seen again this pm for family education. Case worker called therapist regarding family questions. Family educated on back precautions for ADL and recommendations for initial S during mobility. Pt does not need further OT after D/C.   Follow Up Recommendations  No OT follow up    Equipment Recommendations  Tub/shower seat    Recommendations for Other Services      Precautions / Restrictions Precautions Precautions: Back Precaution Comments: Maintains precautions at all times.       Mobility Bed Mobility Overal bed mobility: Needs Assistance     Sidelying to sit: Supervision (vc for technique)       General bed mobility comments: reviewed bed mobility with daughter. Pt able to verbally explain process.  Transfers Overall transfer level: Modified independent                    Balance             Standing balance-Leahy Scale: Good Standing balance comment: improved as session progressed                   ADL                                       Functional mobility during ADLs: Modified independent (with shoes) General ADL Comments: Completed education session with pt's daughter regarding back precautions and ADL/functional mobility for ADL. Demonstrated how pt can use compensatory techniques to adhere to back precautions and how pt completed his dressing this am on his own with me observing while adhering to back precautions.  Educated on toileting and hygiene after toileting. Also educted on home safety and reducing risk of falls. Discussed building in rest breaks during the day to lie in supine/sidelying and using ice  to reduce pain/edema.Daughter verbalized understadning. Recommended initial S for mobility                                      Cognition   Behavior During Therapy: WFL for tasks assessed/performed Overall Cognitive Status: Within Functional Limits for tasks assessed                                                 General Comments  Daughter appreciative of visit    Pertinent Vitals/ Pain       Pain Assessment: Faces Faces Pain Scale: No hurt Pain Location: back Pain Descriptors / Indicators: Aching;Discomfort;Grimacing Pain Intervention(s): Limited activity within patient's tolerance;Monitored during session;Patient requesting pain meds-RN notified  Home Living                                          Prior Functioning/Environment              Frequency       Progress  Toward Goals  OT Goals(current goals can now be found in the care plan section)  Progress towards OT goals: Goals met/education completed, patient discharged from OT  Acute Rehab OT Goals Patient Stated Goal: to go home OT Goal Formulation: With patient Time For Goal Achievement: 05/10/15 Potential to Achieve Goals: Good ADL Goals Pt Will Perform Lower Body Bathing: with set-up;with supervision;with caregiver independent in assisting;sit to/from stand Pt Will Perform Lower Body Dressing: with set-up;with supervision;with caregiver independent in assisting;sit to/from stand Pt Will Transfer to Toilet: with supervision;ambulating;regular height toilet Pt Will Perform Toileting - Clothing Manipulation and hygiene: with supervision;sit to/from stand;with caregiver independent in assisting Additional ADL Goal #1: Pt will verbalize understanding of use of back precatuions for ADL and recommeded useof shower chair  to reduce fall risk  Plan All goals met and education completed, patient discharged from OT services    Co-evaluation                  End of Session Equipment Utilized During Treatment: Gait belt   Activity Tolerance Patient tolerated treatment well   Patient Left in chair;with call bell/phone within reach;with family/visitor present   Nurse Communication Mobility status;Other (comment) (daughter's question about when to shower)        Time: 1550-1602 OT Time Calculation (min): 12 min  Charges: OT General Charges $OT Visit: 1 Procedure OT Treatments $Self Care/Home Management : 8-22 mins  WARD,HILLARY 05/04/2015, 4:11 PM   Hilary Ward, OTR/L  [phone] 05/04/2015 

## 2015-05-07 ENCOUNTER — Encounter (HOSPITAL_COMMUNITY): Payer: Self-pay | Admitting: Emergency Medicine

## 2015-05-07 ENCOUNTER — Inpatient Hospital Stay (HOSPITAL_COMMUNITY)
Admission: EM | Admit: 2015-05-07 | Discharge: 2015-05-09 | DRG: 389 | Disposition: A | Discharge status: Home / Self Care | Payer: Medicaid Other | Attending: Internal Medicine | Admitting: Internal Medicine

## 2015-05-07 ENCOUNTER — Emergency Department (HOSPITAL_COMMUNITY): Payer: Medicaid Other

## 2015-05-07 DIAGNOSIS — I2510 Atherosclerotic heart disease of native coronary artery without angina pectoris: Secondary | ICD-10-CM | POA: Diagnosis present

## 2015-05-07 DIAGNOSIS — Z0189 Encounter for other specified special examinations: Secondary | ICD-10-CM

## 2015-05-07 DIAGNOSIS — H9190 Unspecified hearing loss, unspecified ear: Secondary | ICD-10-CM | POA: Diagnosis present

## 2015-05-07 DIAGNOSIS — E1169 Type 2 diabetes mellitus with other specified complication: Secondary | ICD-10-CM | POA: Insufficient documentation

## 2015-05-07 DIAGNOSIS — Z7982 Long term (current) use of aspirin: Secondary | ICD-10-CM

## 2015-05-07 DIAGNOSIS — K567 Ileus, unspecified: Secondary | ICD-10-CM

## 2015-05-07 DIAGNOSIS — E119 Type 2 diabetes mellitus without complications: Secondary | ICD-10-CM

## 2015-05-07 DIAGNOSIS — E878 Other disorders of electrolyte and fluid balance, not elsewhere classified: Secondary | ICD-10-CM | POA: Diagnosis present

## 2015-05-07 DIAGNOSIS — Z79899 Other long term (current) drug therapy: Secondary | ICD-10-CM

## 2015-05-07 DIAGNOSIS — E871 Hypo-osmolality and hyponatremia: Secondary | ICD-10-CM

## 2015-05-07 DIAGNOSIS — M1990 Unspecified osteoarthritis, unspecified site: Secondary | ICD-10-CM | POA: Diagnosis present

## 2015-05-07 DIAGNOSIS — I252 Old myocardial infarction: Secondary | ICD-10-CM

## 2015-05-07 DIAGNOSIS — E860 Dehydration: Secondary | ICD-10-CM | POA: Diagnosis present

## 2015-05-07 DIAGNOSIS — K5900 Constipation, unspecified: Secondary | ICD-10-CM | POA: Diagnosis present

## 2015-05-07 DIAGNOSIS — K219 Gastro-esophageal reflux disease without esophagitis: Secondary | ICD-10-CM | POA: Diagnosis present

## 2015-05-07 DIAGNOSIS — Z951 Presence of aortocoronary bypass graft: Secondary | ICD-10-CM

## 2015-05-07 DIAGNOSIS — D638 Anemia in other chronic diseases classified elsewhere: Secondary | ICD-10-CM | POA: Diagnosis present

## 2015-05-07 DIAGNOSIS — E785 Hyperlipidemia, unspecified: Secondary | ICD-10-CM | POA: Diagnosis present

## 2015-05-07 DIAGNOSIS — E1140 Type 2 diabetes mellitus with diabetic neuropathy, unspecified: Secondary | ICD-10-CM | POA: Diagnosis present

## 2015-05-07 DIAGNOSIS — K56609 Unspecified intestinal obstruction, unspecified as to partial versus complete obstruction: Secondary | ICD-10-CM

## 2015-05-07 DIAGNOSIS — R110 Nausea: Secondary | ICD-10-CM

## 2015-05-07 DIAGNOSIS — Z7984 Long term (current) use of oral hypoglycemic drugs: Secondary | ICD-10-CM

## 2015-05-07 DIAGNOSIS — K5660 Unspecified intestinal obstruction: Principal | ICD-10-CM | POA: Diagnosis present

## 2015-05-07 LAB — COMPREHENSIVE METABOLIC PANEL
ALK PHOS: 58 U/L (ref 38–126)
ALT: 17 U/L (ref 17–63)
AST: 24 U/L (ref 15–41)
Albumin: 3.8 g/dL (ref 3.5–5.0)
Anion gap: 12 (ref 5–15)
BILIRUBIN TOTAL: 0.8 mg/dL (ref 0.3–1.2)
BUN: 18 mg/dL (ref 6–20)
CALCIUM: 9.4 mg/dL (ref 8.9–10.3)
CO2: 24 mmol/L (ref 22–32)
CREATININE: 0.95 mg/dL (ref 0.61–1.24)
Chloride: 93 mmol/L — ABNORMAL LOW (ref 101–111)
Glucose, Bld: 182 mg/dL — ABNORMAL HIGH (ref 65–99)
Potassium: 3.6 mmol/L (ref 3.5–5.1)
Sodium: 129 mmol/L — ABNORMAL LOW (ref 135–145)
Total Protein: 8.9 g/dL — ABNORMAL HIGH (ref 6.5–8.1)

## 2015-05-07 LAB — I-STAT TROPONIN, ED: Troponin i, poc: 0 ng/mL (ref 0.00–0.08)

## 2015-05-07 LAB — URINALYSIS, ROUTINE W REFLEX MICROSCOPIC
Bilirubin Urine: NEGATIVE
GLUCOSE, UA: NEGATIVE mg/dL
KETONES UR: 15 mg/dL — AB
Leukocytes, UA: NEGATIVE
Nitrite: NEGATIVE
PH: 6.5 (ref 5.0–8.0)
PROTEIN: 100 mg/dL — AB
Specific Gravity, Urine: 1.038 — ABNORMAL HIGH (ref 1.005–1.030)
Urobilinogen, UA: 1 mg/dL (ref 0.0–1.0)

## 2015-05-07 LAB — CBC WITH DIFFERENTIAL/PLATELET
Basophils Absolute: 0 10*3/uL (ref 0.0–0.1)
Basophils Relative: 0 %
EOS PCT: 0 %
Eosinophils Absolute: 0 10*3/uL (ref 0.0–0.7)
HEMATOCRIT: 37.5 % — AB (ref 39.0–52.0)
HEMOGLOBIN: 12.3 g/dL — AB (ref 13.0–17.0)
LYMPHS ABS: 1 10*3/uL (ref 0.7–4.0)
LYMPHS PCT: 10 %
MCH: 27.5 pg (ref 26.0–34.0)
MCHC: 32.8 g/dL (ref 30.0–36.0)
MCV: 83.9 fL (ref 78.0–100.0)
Monocytes Absolute: 0.5 10*3/uL (ref 0.1–1.0)
Monocytes Relative: 5 %
Neutro Abs: 8.9 10*3/uL — ABNORMAL HIGH (ref 1.7–7.7)
Neutrophils Relative %: 85 %
PLATELETS: 358 10*3/uL (ref 150–400)
RBC: 4.47 MIL/uL (ref 4.22–5.81)
RDW: 13.1 % (ref 11.5–15.5)
WBC: 10.4 10*3/uL (ref 4.0–10.5)

## 2015-05-07 LAB — URINE MICROSCOPIC-ADD ON

## 2015-05-07 LAB — LIPASE, BLOOD: LIPASE: 32 U/L (ref 22–51)

## 2015-05-07 MED ORDER — IOHEXOL 300 MG/ML  SOLN
100.0000 mL | Freq: Once | INTRAMUSCULAR | Status: AC | PRN
  Administered 2015-05-07: 100 mL via INTRAVENOUS

## 2015-05-07 MED ORDER — ONDANSETRON HCL 4 MG/2ML IJ SOLN
4.0000 mg | Freq: Once | INTRAMUSCULAR | Status: AC
  Administered 2015-05-07: 4 mg via INTRAVENOUS
  Filled 2015-05-07: qty 2

## 2015-05-07 MED ORDER — IOHEXOL 300 MG/ML  SOLN
25.0000 mL | Freq: Once | INTRAMUSCULAR | Status: AC | PRN
  Administered 2015-05-07: 25 mL via ORAL

## 2015-05-07 MED ORDER — SODIUM CHLORIDE 0.9 % IV BOLUS (SEPSIS)
1000.0000 mL | Freq: Once | INTRAVENOUS | Status: AC
  Administered 2015-05-08: 1000 mL via INTRAVENOUS

## 2015-05-07 NOTE — ED Notes (Signed)
The daughter of the pt is very unhappy  About the pts care.  She wants him to have  Iv fluids.  She reports that she is  A physician and her friends are physicians and they all agree that the pt should have iv fluids.  drgoldstone back in to talk with the daughter

## 2015-05-07 NOTE — ED Notes (Signed)
Hall pass canceled.  Per transporter xray canceled.  CT to send another transporter.

## 2015-05-07 NOTE — ED Provider Notes (Signed)
CSN: [id number]     Arrival date & time 05/07/15  2110 History   First MD Initiated Contact with Patient 05/07/15 2116     Chief Complaint  Patient presents with  . Nausea  . Emesis     (Consider location/radiation/quality/duration/timing/severity/associated sxs/prior Treatment) HPI Comments: Patient is a 74 year old male who is 6 days s/p lumbar laminectomy and decompression microdiscectomy who presents with abdominal pain that started 4 days ago. The pain is located in his epigastrium and does not radiate. The pain is described as aching and moderate. The pain started gradually and remained constant since the onset. This morning, patient started having nausea and 6 episodes of vomiting. No alleviating/aggravating factors. The patient has tried oxycodone for the pain without relief. Patient also has associated constipation. He reports 1 small bowel movement in the past 3 days. Patient denies fever, headache, diarrhea, chest pain, SOB, dysuria.    Past Medical History  Diagnosis Date  . Diabetes mellitus without complication   . Hyperlipemia   . Heart disease   . GERD (gastroesophageal reflux disease)   . Degenerative lumbar spinal stenosis   . Coronary artery disease   . Iron deficiency   . Decreased hearing   . Lower extremity pain   . Nonintractable chronic migraine   . Myocardial infarction   . Arthritis   . Anemia    Past Surgical History  Procedure Laterality Date  . Coronary artery bypass graft      2006  . Cardiac catheterization      2006  . Lumbar laminectomy/decompression microdiscectomy Left 05/02/2015    Procedure: Bilateral Lumbar four five, Left  Lumbar five Sacral one Laminectomy;  Surgeon: Kristeen Miss, MD;  Location: [facility];  Service: Neurosurgery;  Laterality: Left;  Left L4-5 L5-S1 Laminectomy   Family History  Problem Relation Age of Onset  . Stroke Father    Social History  Substance Use Topics  . Smoking status: Never Smoker   . Smokeless  tobacco: Not on file  . Alcohol Use: No    Review of Systems  Gastrointestinal: Positive for nausea, vomiting and abdominal pain.  All other systems reviewed and are negative.     Allergies  Review of patient's allergies indicates no known allergies.  Home Medications   Prior to Admission medications   Medication Sig Start Date End Date Taking? Authorizing Provider  aspirin EC 81 MG tablet Take 81 mg by mouth daily.    Historical Provider, MD  Blood Glucose Monitoring Suppl KIT by Does not apply route.    Historical Provider, MD  Glucose Blood (ACCU-CHEK AVIVA PLUS VI) 1 strip by In Vitro route daily.    Historical Provider, MD  glucose blood test strip 1 each by Other route as needed for other. Use as instructed    Historical Provider, MD  glyBURIDE (DIABETA) 2.5 MG tablet Take 1.25 mg by mouth every other day.     Historical Provider, MD  metFORMIN (GLUCOPHAGE) 500 MG tablet Take 500 mg by mouth 2 (two) times daily with a meal.    Historical Provider, MD  methocarbamol (ROBAXIN) 500 MG tablet Take 1 tablet (500 mg total) by mouth every 6 (six) hours as needed for muscle spasms. 05/04/15   Kristeen Miss, MD  Multiple Vitamin (MULTIVITAMIN WITH MINERALS) TABS tablet Take 1 tablet by mouth daily. Centrum Silver    Historical Provider, MD  multivitamin (METANX) 3-35-2 MG TABS tablet Take 1 tablet by mouth daily. Patient not taking: Reported  on 05/01/2015 02/22/15   Garvin Fila, MD  Omega-3 Fatty Acids (FISH OIL PO) Take 1 capsule by mouth daily.    Historical Provider, MD  omeprazole (PRILOSEC) 40 MG capsule Take 40 mg by mouth daily.    Historical Provider, MD  oxyCODONE-acetaminophen (PERCOCET/ROXICET) 5-325 MG tablet Take 1-2 tablets by mouth every 4 (four) hours as needed for moderate pain. 05/04/15   Kristeen Miss, MD  rosuvastatin (CRESTOR) 10 MG tablet Take 10 mg by mouth at bedtime.     Historical Provider, MD  Vitamin D, Ergocalciferol, (DRISDOL) 50000 UNITS CAPS capsule Take  50,000 Units by mouth every 7 (seven) days. On Thursdays    Historical Provider, MD   SpO2 97% Physical Exam  Constitutional: He is oriented to person, place, and time. He appears well-developed and well-nourished. No distress.  HENT:  Head: Normocephalic and atraumatic.  Eyes: Conjunctivae and EOM are normal.  Neck: Normal range of motion.  Cardiovascular: Normal rate and regular rhythm.  Exam reveals no gallop and no friction rub.   No murmur heard. Pulmonary/Chest: Effort normal and breath sounds normal. He has no wheezes. He has no rales. He exhibits no tenderness.  Abdominal: Soft. He exhibits no distension. There is no tenderness. There is no rebound.  Bruising noted of bilateral flanks. No abdominal tenderness to palpation. Hypoactive bowel sounds.   Musculoskeletal: Normal range of motion.  Vertical incision scar over lumbar spine without drainage or tenderness.   Neurological: He is alert and oriented to person, place, and time. Coordination normal.  Speech is goal-oriented. Moves limbs without ataxia.   Skin: Skin is warm and dry.  Psychiatric: He has a normal mood and affect. His behavior is normal.  Nursing note and vitals reviewed.   ED Course  Procedures (including critical care time) Labs Review Labs Reviewed  CBC WITH DIFFERENTIAL/PLATELET - Abnormal; Notable for the following:    Hemoglobin 12.3 (*)    HCT 37.5 (*)    Neutro Abs 8.9 (*)    All other components within normal limits  URINALYSIS, ROUTINE W REFLEX MICROSCOPIC (NOT AT [facility]) - Abnormal; Notable for the following:    Color, Urine AMBER (*)    APPearance TURBID (*)    Specific Gravity, Urine 1.038 (*)    Hgb urine dipstick MODERATE (*)    Ketones, ur 15 (*)    Protein, ur 100 (*)    All other components within normal limits  URINE MICROSCOPIC-ADD ON - Abnormal; Notable for the following:    Squamous Epithelial / LPF FEW (*)    Bacteria, UA FEW (*)    All other components within normal limits   COMPREHENSIVE METABOLIC PANEL  LIPASE, BLOOD  I-STAT TROPOININ, ED    Imaging Review Ct Abdomen Pelvis W Contrast  05/08/2015   CLINICAL DATA:  Acute onset of nausea, vomiting and generalized abdominal pain. Initial encounter.  EXAM: CT ABDOMEN AND PELVIS WITH CONTRAST  TECHNIQUE: Multidetector CT imaging of the abdomen and pelvis was performed using the standard protocol following bolus administration of intravenous contrast.  CONTRAST:  166m OMNIPAQUE IOHEXOL 300 MG/ML  SOLN  COMPARISON:  MRI of the lumbar spine performed 03/26/2015  FINDINGS: The visualized lung bases are clear. The patient is status post median sternotomy. Diffuse coronary artery calcifications are seen.  A few small calcified granulomata are seen within the right hepatic lobe. The liver and spleen are otherwise unremarkable. A few stones are seen dependently within the gallbladder. The gallbladder is otherwise unremarkable. The pancreas  and adrenal glands are unremarkable.  A few tiny bilateral renal cysts are seen. The kidneys are otherwise unremarkable. Nonspecific perinephric stranding and fluid is noted bilaterally. There is no evidence of hydronephrosis. No renal or ureteral stones are identified.  There is dilatation of the stomach, filled with fluid and air. The duodenum and proximal jejunum are distended, measuring up to 4.4 cm. There is fecalization of the mid to distal jejunum, with gradual decompression. This is thought to reflect dysmotility and partial small-bowel obstruction at the level of the jejunum. There is no definite evidence of a focal transition point.  More distal small bowel loops are completely decompressed. No acute vascular abnormalities are seen. Relatively diffuse calcification is noted along the abdominal aorta.  The appendix is not definitely seen; there is no evidence for appendicitis. The colon is unremarkable in appearance.  The bladder is mildly distended and grossly remarkable. The prostate is  borderline normal in size. No inguinal lymphadenopathy is seen.  No acute osseous abnormalities are identified. Vacuum phenomenon is noted at the lower thoracic and lumbar spine. Facet disease is noted at the lower lumbar spine.  IMPRESSION: 1. Dilatation of the stomach with fluid and air. Distention of the duodenum and proximal jejunum, measuring up to 4.4 cm in diameter. The location of the mid to distal jejunum, with gradual decompression. This is thought to reflect small bowel dysmotility and partial small obstruction at the level of the jejunum. No definite evidence of a focal transition point. The more distal small bowel is decompressed. 2. Cholelithiasis; gallbladder otherwise unremarkable. 3. Diffuse coronary artery calcifications seen. 4. Relatively diffuse calcification along the abdominal aorta. 5. Few tiny bilateral renal cysts seen. 6. Minimal degenerative change along the lower thoracic and lumbar spine.   Electronically Signed   By: Garald Balding M.D.   On: 05/08/2015 00:21   I have personally reviewed and evaluated these images and lab results as part of my medical decision-making.   EKG Interpretation None      MDM   Final diagnoses:  Encounter for imaging study to confirm nasogastric (NG) tube placement  Small bowel obstruction ([facility])  Small bowel obstruction ([place])    11:02 PM Labs and CT abdomen pending.   CT shows gastric outlet obstruction. Patient will be admitted for further management.    Alvina Chou, PA-C 05/08/15 0421  Sherwood Gambler, MD 05/10/15 [phone]

## 2015-05-07 NOTE — ED Notes (Signed)
Pt to ct 

## 2015-05-07 NOTE — ED Notes (Signed)
In by EMS with nausea vomiting.  Per daughter patient had not been having pain after having back surgery on Wednesday however patient thought he needed to take the oxycodone scheduled.  Took three oxycodone on Thursday and Friday then developed constipation yesterday.  Had small bm this morning and started having nausea and vomiting.  Abdomen distended with hypo bowel sounds.

## 2015-05-08 ENCOUNTER — Inpatient Hospital Stay (HOSPITAL_COMMUNITY): Payer: Medicaid Other

## 2015-05-08 ENCOUNTER — Encounter (HOSPITAL_COMMUNITY): Payer: Self-pay

## 2015-05-08 DIAGNOSIS — E860 Dehydration: Secondary | ICD-10-CM | POA: Diagnosis present

## 2015-05-08 DIAGNOSIS — I252 Old myocardial infarction: Secondary | ICD-10-CM | POA: Diagnosis not present

## 2015-05-08 DIAGNOSIS — Z7982 Long term (current) use of aspirin: Secondary | ICD-10-CM | POA: Diagnosis not present

## 2015-05-08 DIAGNOSIS — K567 Ileus, unspecified: Secondary | ICD-10-CM

## 2015-05-08 DIAGNOSIS — H9190 Unspecified hearing loss, unspecified ear: Secondary | ICD-10-CM | POA: Diagnosis present

## 2015-05-08 DIAGNOSIS — K56609 Unspecified intestinal obstruction, unspecified as to partial versus complete obstruction: Secondary | ICD-10-CM | POA: Diagnosis present

## 2015-05-08 DIAGNOSIS — E785 Hyperlipidemia, unspecified: Secondary | ICD-10-CM | POA: Insufficient documentation

## 2015-05-08 DIAGNOSIS — K5900 Constipation, unspecified: Secondary | ICD-10-CM | POA: Diagnosis present

## 2015-05-08 DIAGNOSIS — D638 Anemia in other chronic diseases classified elsewhere: Secondary | ICD-10-CM | POA: Diagnosis present

## 2015-05-08 DIAGNOSIS — E119 Type 2 diabetes mellitus without complications: Secondary | ICD-10-CM | POA: Insufficient documentation

## 2015-05-08 DIAGNOSIS — K5669 Other intestinal obstruction: Secondary | ICD-10-CM | POA: Diagnosis not present

## 2015-05-08 DIAGNOSIS — R110 Nausea: Secondary | ICD-10-CM

## 2015-05-08 DIAGNOSIS — Z951 Presence of aortocoronary bypass graft: Secondary | ICD-10-CM | POA: Diagnosis not present

## 2015-05-08 DIAGNOSIS — E871 Hypo-osmolality and hyponatremia: Secondary | ICD-10-CM

## 2015-05-08 DIAGNOSIS — E878 Other disorders of electrolyte and fluid balance, not elsewhere classified: Secondary | ICD-10-CM | POA: Diagnosis present

## 2015-05-08 DIAGNOSIS — R112 Nausea with vomiting, unspecified: Secondary | ICD-10-CM | POA: Diagnosis not present

## 2015-05-08 DIAGNOSIS — I2510 Atherosclerotic heart disease of native coronary artery without angina pectoris: Secondary | ICD-10-CM | POA: Diagnosis present

## 2015-05-08 DIAGNOSIS — K5660 Unspecified intestinal obstruction: Secondary | ICD-10-CM | POA: Diagnosis present

## 2015-05-08 DIAGNOSIS — Z7984 Long term (current) use of oral hypoglycemic drugs: Secondary | ICD-10-CM | POA: Diagnosis not present

## 2015-05-08 DIAGNOSIS — E1140 Type 2 diabetes mellitus with diabetic neuropathy, unspecified: Secondary | ICD-10-CM | POA: Diagnosis present

## 2015-05-08 DIAGNOSIS — M1990 Unspecified osteoarthritis, unspecified site: Secondary | ICD-10-CM | POA: Diagnosis present

## 2015-05-08 DIAGNOSIS — K219 Gastro-esophageal reflux disease without esophagitis: Secondary | ICD-10-CM | POA: Diagnosis present

## 2015-05-08 DIAGNOSIS — Z79899 Other long term (current) drug therapy: Secondary | ICD-10-CM | POA: Diagnosis not present

## 2015-05-08 LAB — COMPREHENSIVE METABOLIC PANEL
ALBUMIN: 3.5 g/dL (ref 3.5–5.0)
ALT: 16 U/L — AB (ref 17–63)
AST: 20 U/L (ref 15–41)
Alkaline Phosphatase: 51 U/L (ref 38–126)
Anion gap: 10 (ref 5–15)
BUN: 17 mg/dL (ref 6–20)
CHLORIDE: 99 mmol/L — AB (ref 101–111)
CO2: 25 mmol/L (ref 22–32)
CREATININE: 0.95 mg/dL (ref 0.61–1.24)
Calcium: 9.5 mg/dL (ref 8.9–10.3)
GFR calc Af Amer: >60 mL/min (ref >60)
GFR calc non Af Amer: >60 mL/min (ref >60)
Glucose, Bld: 179 mg/dL — ABNORMAL HIGH (ref 65–99)
Potassium: 4 mmol/L (ref 3.5–5.1)
SODIUM: 134 mmol/L — AB (ref 135–145)
Total Bilirubin: 1 mg/dL (ref 0.3–1.2)
Total Protein: 7.5 g/dL (ref 6.5–8.1)

## 2015-05-08 LAB — GLUCOSE, CAPILLARY
Glucose-Capillary: 165 mg/dL — ABNORMAL HIGH (ref 65–99)
Glucose-Capillary: 85 mg/dL (ref 65–99)
Glucose-Capillary: 121 mg/dL — ABNORMAL HIGH (ref 65–99)
Glucose-Capillary: 142 mg/dL — ABNORMAL HIGH (ref 65–99)

## 2015-05-08 LAB — PHOSPHORUS: PHOSPHORUS: 3.8 mg/dL (ref 2.5–4.6)

## 2015-05-08 LAB — CBC
HEMATOCRIT: 35 % — AB (ref 39.0–52.0)
Hemoglobin: 11.6 g/dL — ABNORMAL LOW (ref 13.0–17.0)
MCH: 27.8 pg (ref 26.0–34.0)
MCHC: 33.1 g/dL (ref 30.0–36.0)
MCV: 83.7 fL (ref 78.0–100.0)
PLATELETS: 329 10*3/uL (ref 150–400)
RBC: 4.18 MIL/uL — ABNORMAL LOW (ref 4.22–5.81)
RDW: 13.1 % (ref 11.5–15.5)
WBC: 9.9 10*3/uL (ref 4.0–10.5)

## 2015-05-08 LAB — MAGNESIUM: Magnesium: 2.1 mg/dL (ref 1.7–2.4)

## 2015-05-08 IMAGING — CR DG ABDOMEN 2V
2 series · 2 of 2 positions shown · non-contrast
Comparison: CT Abdomen and Pelvis [8N] hours today, and earlier.

CLINICAL DATA: 74-year-old male NG tube placement. Initial
encounter.

EXAM:
ABDOMEN - 2 VIEW

[abdomen erect]
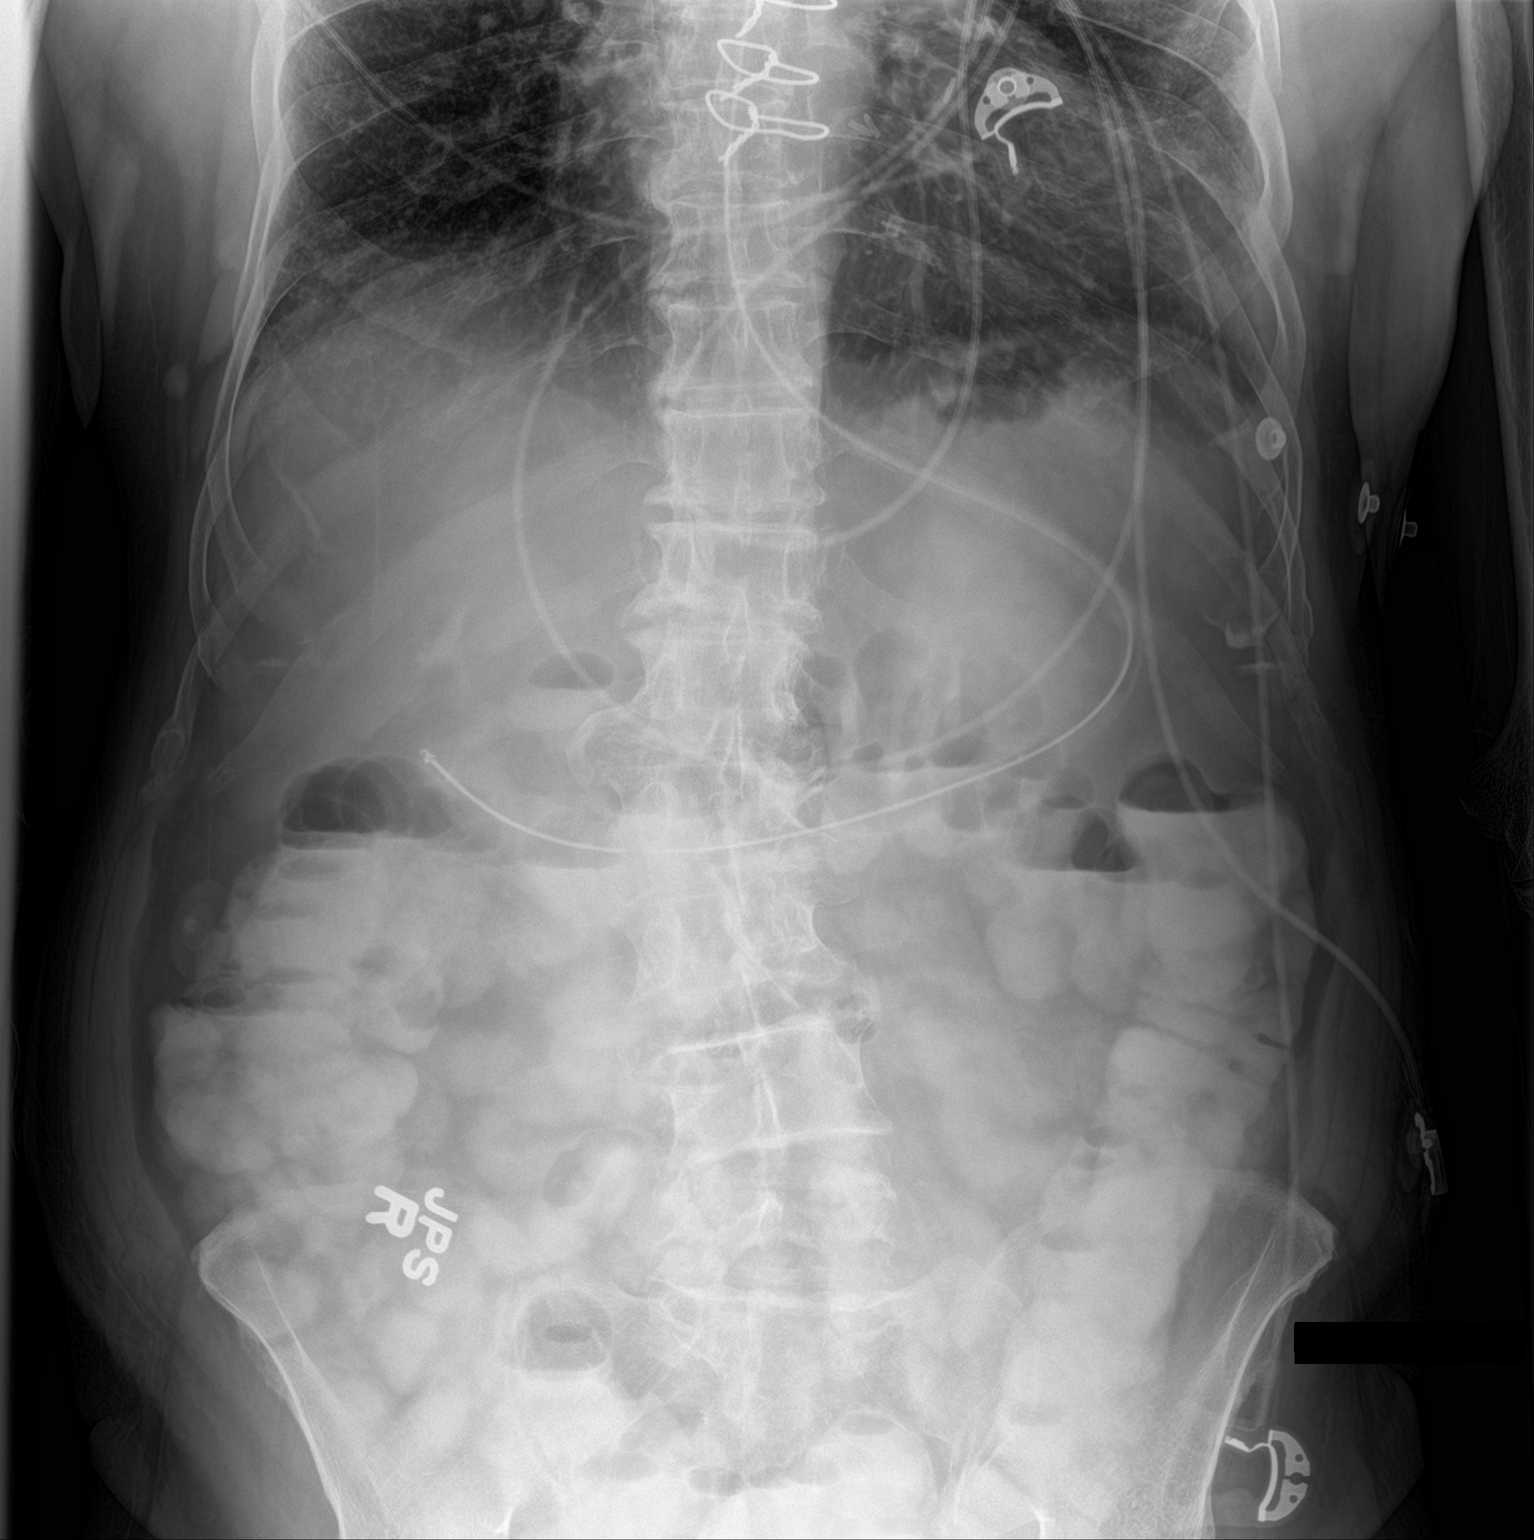

[abdomen supine]
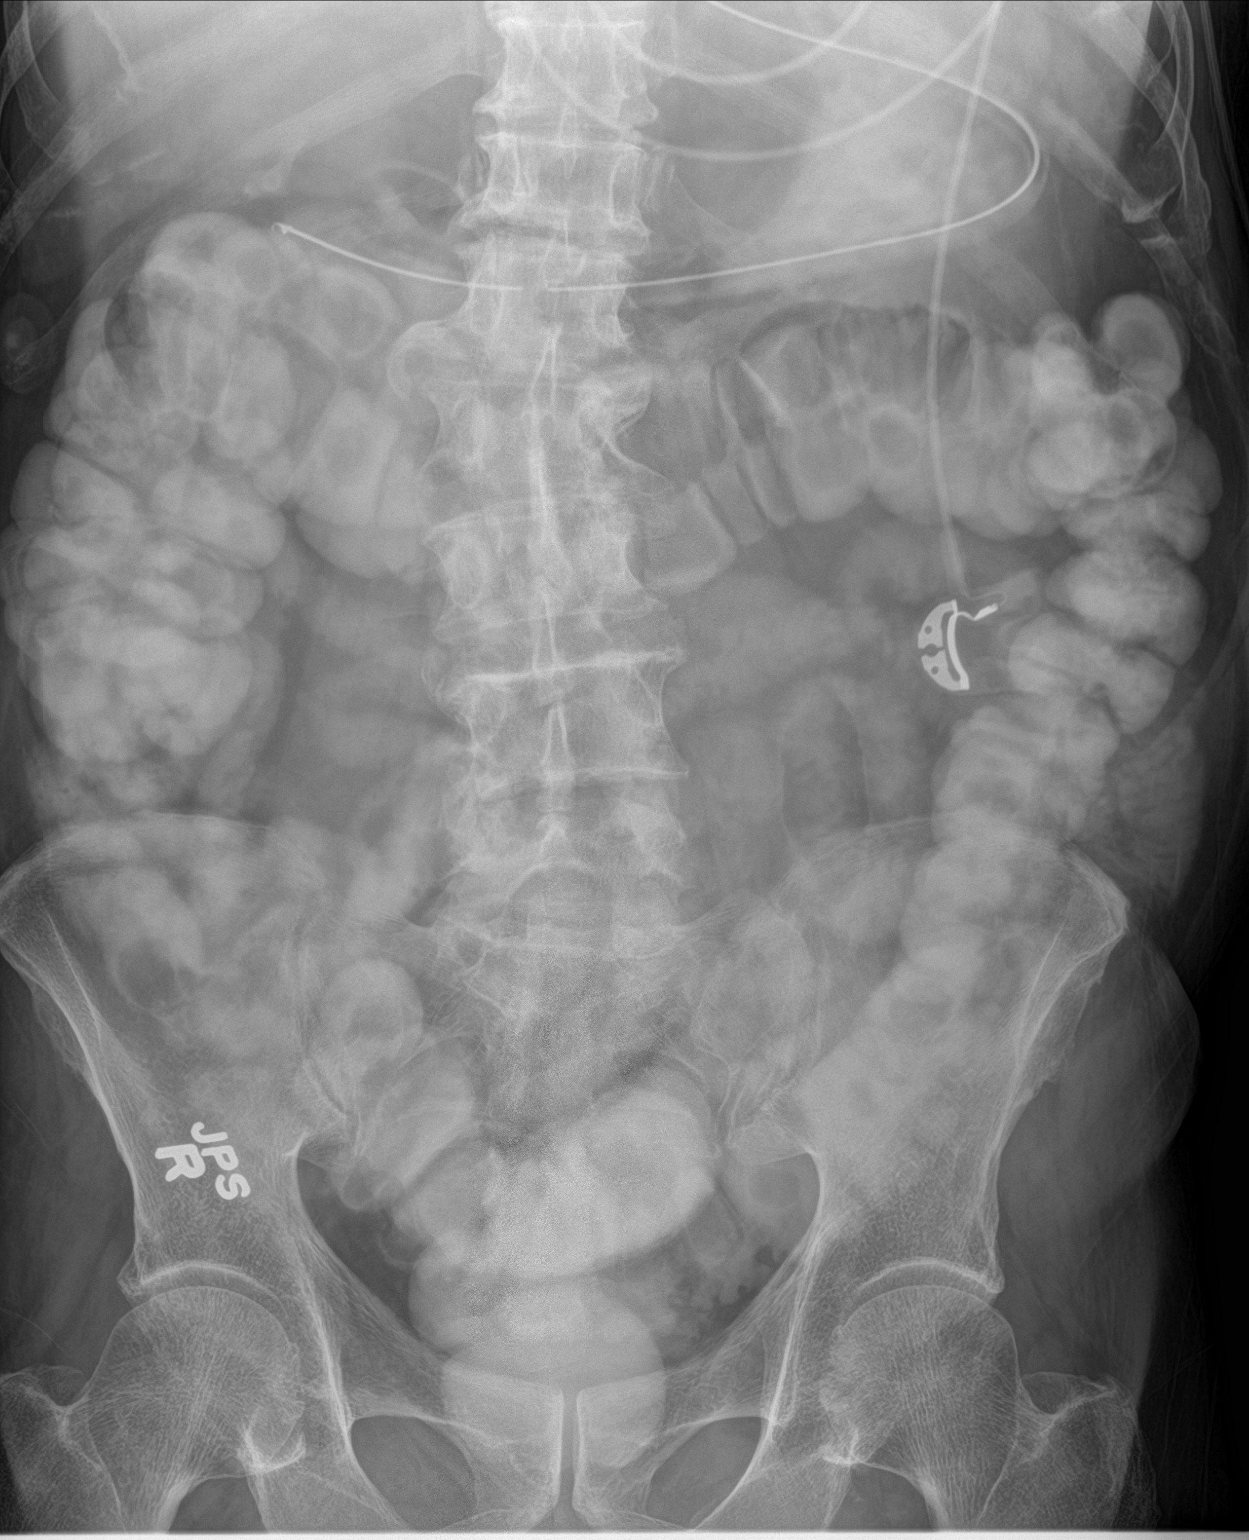

[2 of 2 positions shown; findings below may reference images not displayed]

FINDINGS: Upright and supine views of the abdomen and pelvis. There is now a
oral contrast throughout the distal small and large bowel to the
level of the rectum. No pneumoperitoneum. No dilated loops. NG tube
in place, tip at the level of the gastric antrum or duodenum bulb.
Side hole the level of the distal stomach. Osteopenia, scoliosis,
degenerative changes in the spine.
IMPRESSION: 1. NG tube placed to the distal stomach.
2. Normal bowel gas pattern with oral contrast now throughout distal
small bowel and large bowel loops to the rectum. No free air.

## 2015-05-08 IMAGING — CT CT ABD-PELV W/ CM
2 of 5 series · 15 of 46 positions shown, 17 images · IV contrast (APPLIED)
Comparison: MRI of the lumbar spine performed [DATE]

CLINICAL DATA: Acute onset of nausea, vomiting and generalized
abdominal pain. Initial encounter.

EXAM:
CT ABDOMEN AND PELVIS WITH CONTRAST
TECHNIQUE: Multidetector CT imaging of the abdomen and pelvis was performed
using the standard protocol following bolus administration of
intravenous contrast.
CONTRAST:  100mL OMNIPAQUE IOHEXOL 300 MG/ML  SOLN

[Series 2: abd/ pelvis 5.0 i30f 1 · axial · 0.88mm/px · z∈[+862,+1282]mm · 12 of 96 slices shown, 14 images]
[im 6/96  soft-tissue]
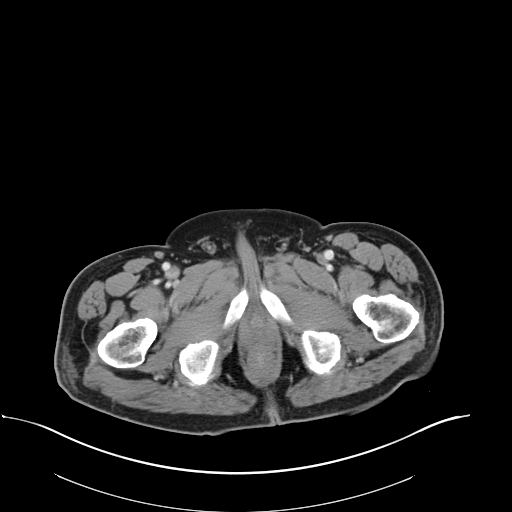
[im 6/96  bone]
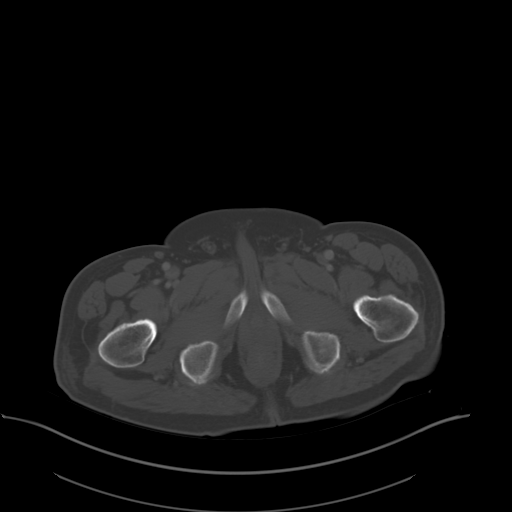
[im 16/96  soft-tissue]
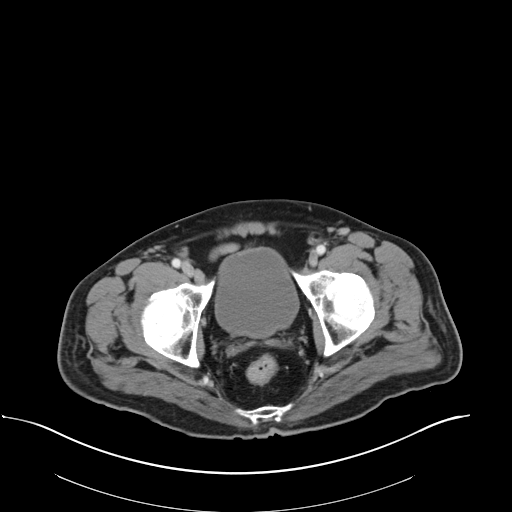
[im 22/96  soft-tissue]
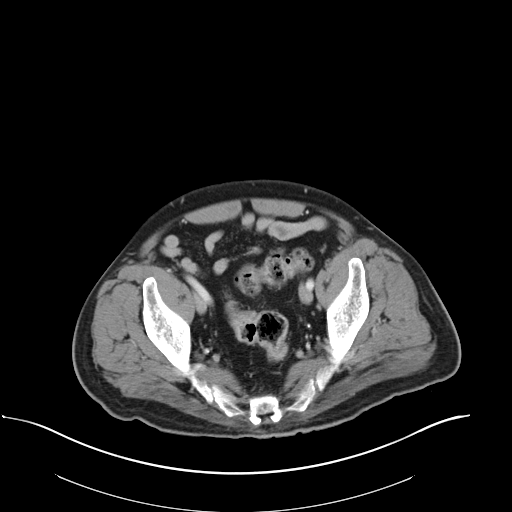
[im 27/96  soft-tissue]
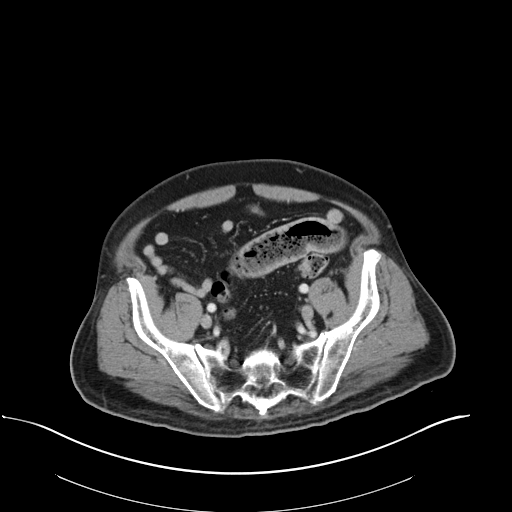
[im 37/96  soft-tissue]
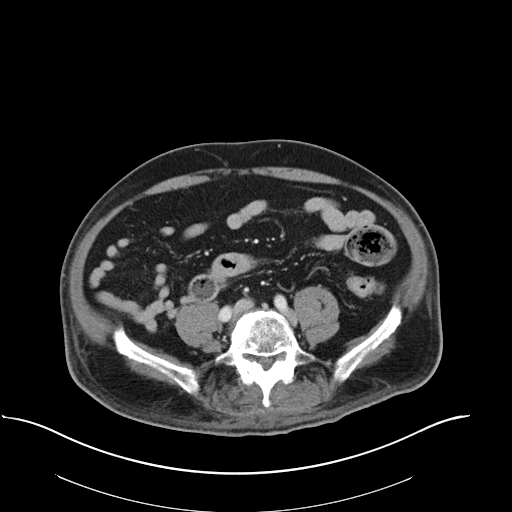
[im 43/96  soft-tissue]
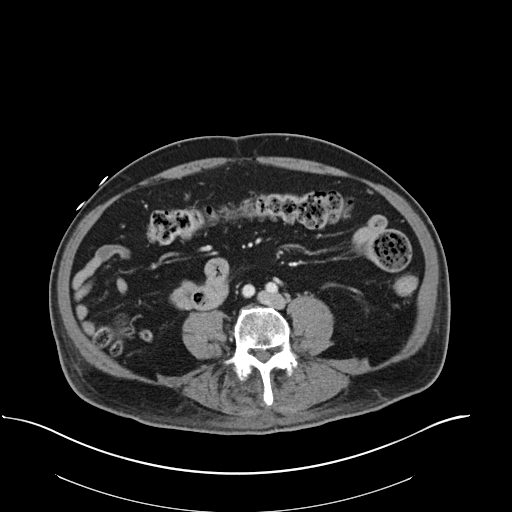
[im 53/96  soft-tissue]
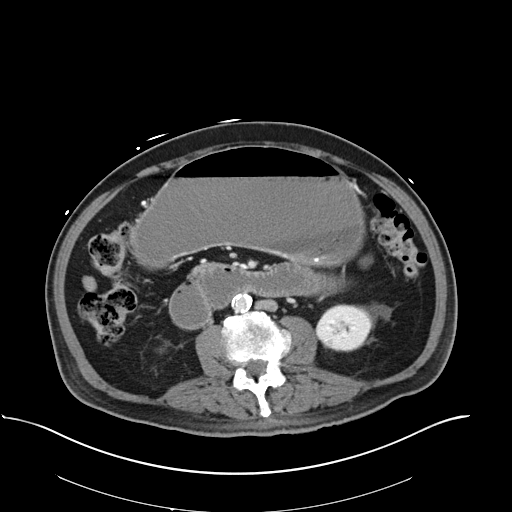
[im 59/96  soft-tissue]
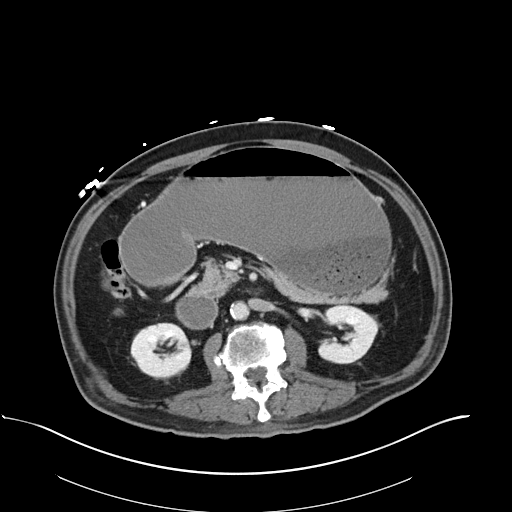
[im 69/96  soft-tissue]
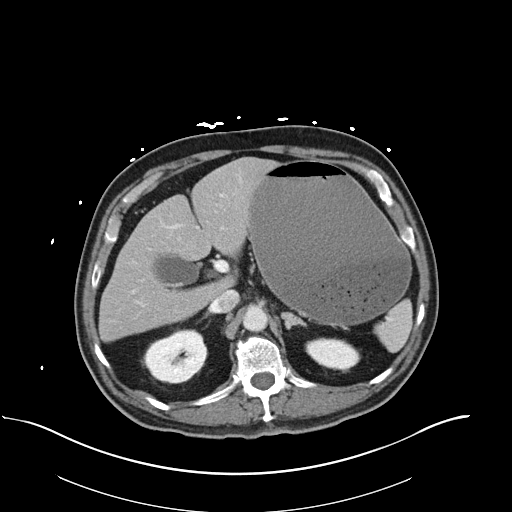
[im 69/96  bone]
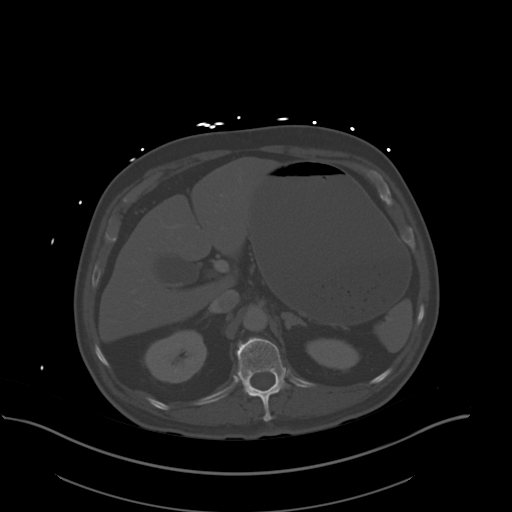
[im 74/96  soft-tissue]
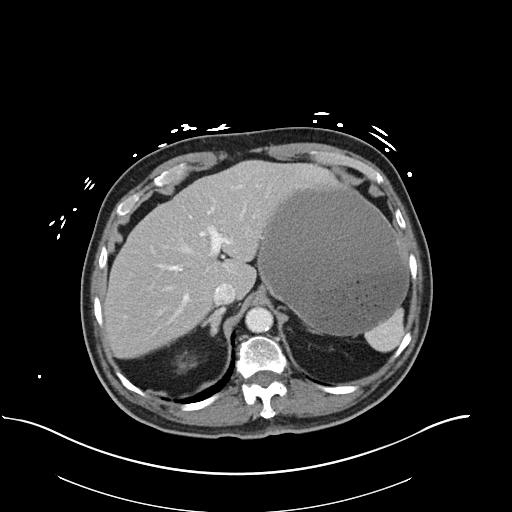
[im 80/96  soft-tissue]
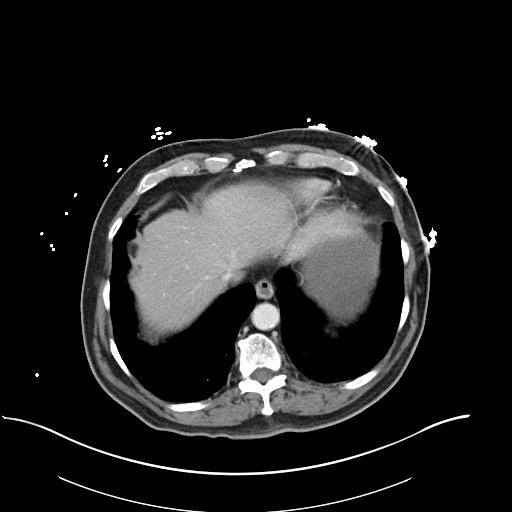
[im 90/96  soft-tissue]
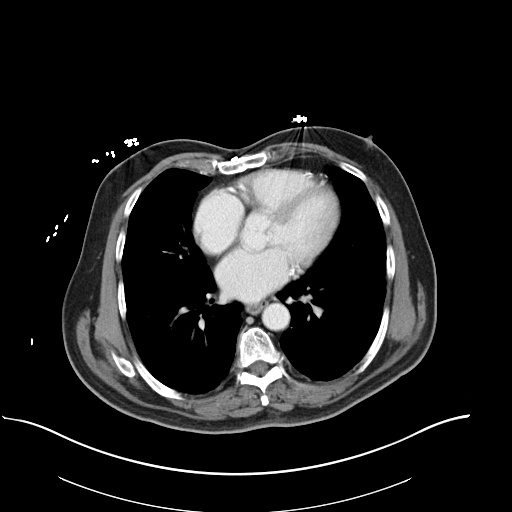

[Series 5: coronal soft tissue · coronal · 0.79mm/px · 3 of 99 slices shown]
[im 33/99  soft-tissue]
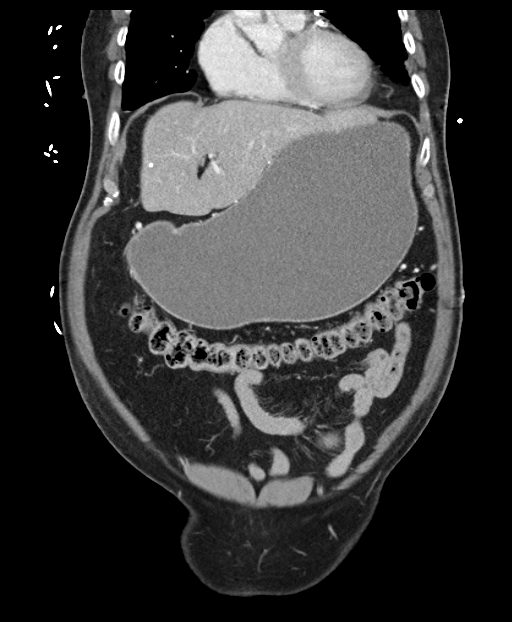
[im 44/99  soft-tissue]
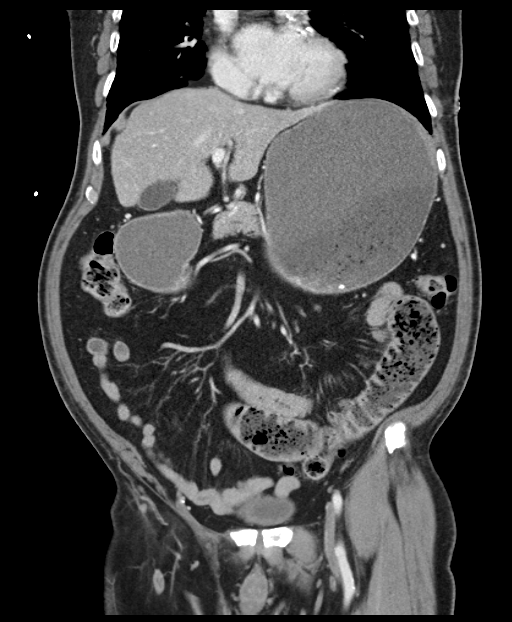
[im 55/99  soft-tissue]
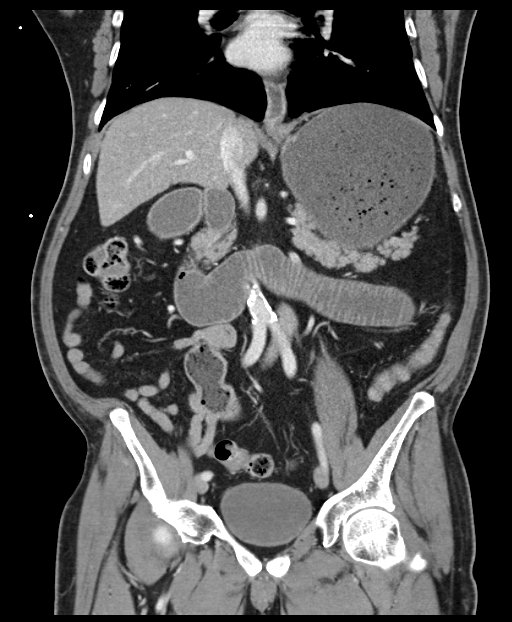

[15 of 46 positions shown; findings below may reference images not displayed]

FINDINGS: The visualized lung bases are clear. The patient is status post
median sternotomy. Diffuse coronary artery calcifications are seen.

A few small calcified granulomata are seen within the right hepatic
lobe. The liver and spleen are otherwise unremarkable. A few stones
are seen dependently within the gallbladder. The gallbladder is
otherwise unremarkable. The pancreas and adrenal glands are
unremarkable.

A few tiny bilateral renal cysts are seen. The kidneys are otherwise
unremarkable. Nonspecific perinephric stranding and fluid is noted
bilaterally. There is no evidence of hydronephrosis. No renal or
ureteral stones are identified.

There is dilatation of the stomach, filled with fluid and air. The
duodenum and proximal jejunum are distended, measuring up to 4.4 cm.
There is fecalization of the mid to distal jejunum, with gradual
decompression. This is thought to reflect dysmotility and partial
small-bowel obstruction at the level of the jejunum. There is no
definite evidence of a focal transition point.

More distal small bowel loops are completely decompressed. No acute
vascular abnormalities are seen. Relatively diffuse calcification is
noted along the abdominal aorta.

The appendix is not definitely seen; there is no evidence for
appendicitis. The colon is unremarkable in appearance.

The bladder is mildly distended and grossly remarkable. The prostate
is borderline normal in size. No inguinal lymphadenopathy is seen.

No acute osseous abnormalities are identified. Vacuum phenomenon is
noted at the lower thoracic and lumbar spine. Facet disease is noted
at the lower lumbar spine.
IMPRESSION: 1. Dilatation of the stomach with fluid and air. Distention of the
duodenum and proximal jejunum, measuring up to 4.4 cm in diameter.
The location of the mid to distal jejunum, with gradual
decompression. This is thought to reflect small bowel dysmotility
and partial small obstruction at the level of the jejunum. No
definite evidence of a focal transition point. The more distal small
bowel is decompressed.
2. Cholelithiasis; gallbladder otherwise unremarkable.
3. Diffuse coronary artery calcifications seen.
4. Relatively diffuse calcification along the abdominal aorta.
5. Few tiny bilateral renal cysts seen.
6. Minimal degenerative change along the lower thoracic and lumbar
spine.

## 2015-05-08 MED ORDER — SODIUM CHLORIDE 0.9 % IV SOLN
INTRAVENOUS | Status: AC
  Administered 2015-05-08: 02:00:00 via INTRAVENOUS

## 2015-05-08 MED ORDER — DIATRIZOATE MEGLUMINE & SODIUM 66-10 % PO SOLN
ORAL | Status: AC
  Administered 2015-05-08: 04:00:00
  Filled 2015-05-08: qty 90

## 2015-05-08 MED ORDER — ASPIRIN EC 81 MG PO TBEC: 81.0000 mg | DELAYED_RELEASE_TABLET | Freq: Every day | ORAL | Status: DC

## 2015-05-08 MED ORDER — PANTOPRAZOLE SODIUM 40 MG PO TBEC
40.0000 mg | DELAYED_RELEASE_TABLET | Freq: Every day | ORAL | Status: DC
  Administered 2015-05-08: 40 mg via ORAL
  Filled 2015-05-08: qty 1

## 2015-05-08 MED ORDER — ACETAMINOPHEN 650 MG RE SUPP: 650.0000 mg | Freq: Four times a day (QID) | RECTAL | Status: DC | PRN

## 2015-05-08 MED ORDER — SODIUM CHLORIDE 0.9 % IV SOLN
INTRAVENOUS | Status: DC
  Administered 2015-05-08: 09:00:00 via INTRAVENOUS

## 2015-05-08 MED ORDER — INSULIN ASPART 100 UNIT/ML ~~LOC~~ SOLN
0.0000 [IU] | Freq: Three times a day (TID) | SUBCUTANEOUS | Status: DC
  Administered 2015-05-08 – 2015-05-09 (×2): 1 [IU] via SUBCUTANEOUS

## 2015-05-08 MED ORDER — ROSUVASTATIN CALCIUM 10 MG PO TABS
10.0000 mg | ORAL_TABLET | Freq: Every day | ORAL | Status: DC
  Filled 2015-05-08: qty 1

## 2015-05-08 MED ORDER — ROSUVASTATIN CALCIUM 10 MG PO TABS
10.0000 mg | ORAL_TABLET | Freq: Every day | ORAL | Status: DC
  Administered 2015-05-08: 10 mg via ORAL
  Filled 2015-05-08 (×2): qty 1

## 2015-05-08 MED ORDER — DEXTROSE-NACL 5-0.9 % IV SOLN
INTRAVENOUS | Status: DC
  Administered 2015-05-08: 04:00:00 via INTRAVENOUS

## 2015-05-08 MED ORDER — DIATRIZOATE MEGLUMINE & SODIUM 66-10 % PO SOLN
90.0000 mL | Freq: Once | ORAL | Status: AC
  Administered 2015-05-08: 90 mL via NASOGASTRIC

## 2015-05-08 MED ORDER — ONDANSETRON HCL 4 MG PO TABS
4.0000 mg | ORAL_TABLET | Freq: Four times a day (QID) | ORAL | Status: DC | PRN
Start: 2015-05-08 — End: 2015-05-09

## 2015-05-08 MED ORDER — METHOCARBAMOL 500 MG PO TABS: 500.0000 mg | ORAL_TABLET | Freq: Four times a day (QID) | ORAL | Status: DC | PRN

## 2015-05-08 MED ORDER — INSULIN ASPART 100 UNIT/ML ~~LOC~~ SOLN: 0.0000 [IU] | Freq: Three times a day (TID) | SUBCUTANEOUS | Status: DC

## 2015-05-08 MED ORDER — SODIUM CHLORIDE 0.9 % IJ SOLN: 3.0000 mL | Freq: Two times a day (BID) | INTRAMUSCULAR | Status: DC

## 2015-05-08 MED ORDER — ONDANSETRON HCL 4 MG/2ML IJ SOLN: 4.0000 mg | Freq: Four times a day (QID) | INTRAMUSCULAR | Status: DC | PRN

## 2015-05-08 MED ORDER — PANTOPRAZOLE SODIUM 40 MG IV SOLR
40.0000 mg | Freq: Every day | INTRAVENOUS | Status: DC
  Filled 2015-05-08: qty 40

## 2015-05-08 MED ORDER — ACETAMINOPHEN 325 MG PO TABS: 650.0000 mg | ORAL_TABLET | Freq: Four times a day (QID) | ORAL | Status: DC | PRN

## 2015-05-08 MED ORDER — ASPIRIN EC 81 MG PO TBEC
81.0000 mg | DELAYED_RELEASE_TABLET | Freq: Every day | ORAL | Status: DC
  Administered 2015-05-08 – 2015-05-09 (×2): 81 mg via ORAL
  Filled 2015-05-08 (×2): qty 1

## 2015-05-08 MED ORDER — INSULIN ASPART 100 UNIT/ML ~~LOC~~ SOLN
0.0000 [IU] | SUBCUTANEOUS | Status: DC
  Administered 2015-05-08: 2 [IU] via SUBCUTANEOUS

## 2015-05-08 NOTE — Progress Notes (Signed)
   Contrast in colon on AXR.  DC NGT and allow for clears, advance as tolerated.  Will sign off.  Please call for further assistance.  Jennett Tarbell, ANP-BC

## 2015-05-08 NOTE — ED Notes (Signed)
Report to rachel rn

## 2015-05-08 NOTE — Care Management Note (Signed)
Case Management Note  Patient Details  Name: Paul Villarreal MRN: [id number] Date of Birth: [birth year]  Subjective/Objective:                    Action/Plan:  Initial UR completed  Expected Discharge Date:                  Expected Discharge Plan:  Home/Self Care  In-House Referral:     Discharge planning Services     Post Acute Care Choice:    Choice offered to:     DME Arranged:    DME Agency:     HH Arranged:    HH Agency:     Status of Service:  In process, will continue to follow  Medicare Important Message Given:    Date Medicare IM Given:    Medicare IM give by:    Date Additional Medicare IM Given:    Additional Medicare Important Message give by:     If discussed at Long Length of Stay Meetings, dates discussed:    Additional Comments:  Kingsley Plan, RN 05/08/2015, 2:33 PM

## 2015-05-08 NOTE — ED Notes (Addendum)
#  14 [place] sump inserted.  Pt tolerated it well.  Yellow mustard colored liquid drained 1000cc

## 2015-05-08 NOTE — Progress Notes (Addendum)
[facility] Progress Note   Paul Villarreal  BJY:[id number]  DOB: 1940-12-31  DOA: 05/07/2015 PCP: No primary care provider on file.  Brief narrative: Paul Villarreal is a 74 y.o. male with DM, HLP, GERD, CAD s/p lumbar laminectomy on 9/29. The patient presented with nausea vomiting and abdominal pain and was found to have a CT that suggested partial small bowel obstruction versus dysmotility. The patient had taken 3 oxycodones earlier in the day. The patient was admitted, an NG tube was placed and surgery was consulted.   Subjective: Evaluated this morning. He did not complain of any nausea vomiting or abdominal pain. He had a bowel movement toay. No complaint of fever chills or sweats.  Assessment/Plan: Principal Problem:   Small bowel obstruction (HCC) -Likely small bowel slowing/ileus secondary to narcotics -he had active bowel sounds this morning and had a bowel movement and therefore NG tube was clamped and he was started on clear liquids -He continues to progress and is tolerating full liquids at lunchtime and therefore will be advanced to solid food for dinner -Hopefully home tomorrow  Active Problems:  Hyponatremia/hypochloremia -Likely secondary to dehydration -Sodium was 129 this morning and has improved to 134 -Will continue normal saline at 50 mL an hour and recheck basic metabolic panel tomorrow morning  Recent lumbar laminectomy -The patient actually states that he was not having significant pain postprocedure and does not need to take further narcotics  Anemia -Likely anemia of chronic disease    Type 2 diabetes mellitus without complication (HCC) -Holding glyburide and metformin-continue sliding scale for now  Hyperlipidemia -Can resume statin tonight   Code Status:     Code Status Orders        Start     Ordered   05/08/15 0229  Full code   Continuous     05/08/15 0228     Family Communication: In detail with daughter and  son Disposition Plan: Home tomorrow if he continues to progress DVT prophylaxis: SCDs Consultants: Gen. surgery  Antibiotics: Anti-infectives    None      Objective: Filed Weights   05/07/15 2117 05/08/15 0229  Weight: 71.215 kg (157 lb) 69.8 kg (153 lb 14.1 oz)    Intake/Output Summary (Last 24 hours) at 05/08/15 1737 Last data filed at 05/08/15 1400  Gross per 24 hour  Intake 2276.8 ml  Output   2300 ml  Net  -23.2 ml     Vitals Filed Vitals:   05/07/15 2300 05/07/15 2330 05/08/15 0229 05/08/15 0652  BP: 113/62 142/70 137/63 137/72  Pulse: 69 73 73 75  Temp:   98.7 F (37.1 C) 98.6 F (37 C)  TempSrc:   Oral   Resp: Height:    (1.626 m)   Weight:   69.8 kg (153 lb 14.1 oz)   SpO2: 98% 99% 98% 100%    Exam:  General:  Pt is alert, not in acute distress  HEENT: No icterus, No thrush, oral mucosa moist  Cardiovascular: regular rate and rhythm, S1/S2 No murmur  Respiratory: clear to auscultation bilaterally   Abdomen: Soft, +Bowel sounds, non tender, non distended, no guarding  MSK: No LE edema, cyanosis or clubbing  Data Reviewed: Basic Metabolic Panel:  Recent Labs Lab 05/02/15 1335 05/07/15 2212 05/08/15 0457  NA 132* 129* 134*  K 3.8 3.6 4.0  CL 100* 93* 99*  CO2 GLUCOSE 106* 182* 179*  BUN CREATININE 0.93  0.95 0.95  CALCIUM 9.0 9.4 9.5  MG  --   --  2.1  PHOS  --   --  3.8   Liver Function Tests:  Recent Labs Lab 05/07/15 2212 05/08/15 0457  AST 24 20  ALT 17 16*  ALKPHOS 58 51  BILITOT 0.8 1.0  PROT 8.9* 7.5  ALBUMIN 3.8 3.5    Recent Labs Lab 05/07/15 2212  LIPASE 32   No results for input(s): AMMONIA in the last 168 hours. CBC:  Recent Labs Lab 05/02/15 1335 05/07/15 2212 05/08/15 0457  WBC 8.4 10.4 9.9  NEUTROABS  --  8.9*  --   HGB 12.1* 12.3* 11.6*  HCT 36.2* 37.5* 35.0*  MCV 83.2 83.9 83.7  PLT 269 358 329   Cardiac Enzymes: No results for input(s): CKTOTAL,  CKMB, CKMBINDEX, TROPONINI in the last 168 hours. BNP (last 3 results) No results for input(s): BNP in the last 8760 hours.  ProBNP (last 3 results) No results for input(s): PROBNP in the last 8760 hours.  CBG:  Recent Labs Lab 05/04/15 0635 05/04/15 1135 05/08/15 0733 05/08/15 1151 05/08/15 1704  GLUCAP 125* 134* 165* 85 121*    Recent Results (from the past 240 hour(s))  Surgical pcr screen     Status: Abnormal   Collection Time: 05/02/15  1:35 PM  Result Value Ref Range Status   MRSA, PCR NEGATIVE NEGATIVE Final   Staphylococcus aureus POSITIVE (A) NEGATIVE Final    Comment:        The Xpert SA Assay (FDA approved for NASAL specimens in patients over 86 years of age), is one component of a comprehensive surveillance program.  Test performance has been validated by [facility] for patients greater than or equal to 87 year old. It is not intended to diagnose infection nor to guide or monitor treatment.      Studies: Ct Abdomen Pelvis W Contrast  05/08/2015   CLINICAL DATA:  Acute onset of nausea, vomiting and generalized abdominal pain. Initial encounter.  EXAM: CT ABDOMEN AND PELVIS WITH CONTRAST  TECHNIQUE: Multidetector CT imaging of the abdomen and pelvis was performed using the standard protocol following bolus administration of intravenous contrast.  CONTRAST:  OMNIPAQUE IOHEXOL 300 MG/ML  SOLN  COMPARISON:  MRI of the lumbar spine performed 03/26/2015  FINDINGS: The visualized lung bases are clear. The patient is status post median sternotomy. Diffuse coronary artery calcifications are seen.  A few small calcified granulomata are seen within the right hepatic lobe. The liver and spleen are otherwise unremarkable. A few stones are seen dependently within the gallbladder. The gallbladder is otherwise unremarkable. The pancreas and adrenal glands are unremarkable.  A few tiny bilateral renal cysts are seen. The kidneys are otherwise unremarkable. Nonspecific  perinephric stranding and fluid is noted bilaterally. There is no evidence of hydronephrosis. No renal or ureteral stones are identified.  There is dilatation of the stomach, filled with fluid and air. The duodenum and proximal jejunum are distended, measuring up to 4.4 cm. There is fecalization of the mid to distal jejunum, with gradual decompression. This is thought to reflect dysmotility and partial small-bowel obstruction at the level of the jejunum. There is no definite evidence of a focal transition point.  More distal small bowel loops are completely decompressed. No acute vascular abnormalities are seen. Relatively diffuse calcification is noted along the abdominal aorta.  The appendix is not definitely seen; there is no evidence for appendicitis. The colon is unremarkable in appearance.  The  bladder is mildly distended and grossly remarkable. The prostate is borderline normal in size. No inguinal lymphadenopathy is seen.  No acute osseous abnormalities are identified. Vacuum phenomenon is noted at the lower thoracic and lumbar spine. Facet disease is noted at the lower lumbar spine.  IMPRESSION: 1. Dilatation of the stomach with fluid and air. Distention of the duodenum and proximal jejunum, measuring up to 4.4 cm in diameter. The location of the mid to distal jejunum, with gradual decompression. This is thought to reflect small bowel dysmotility and partial small obstruction at the level of the jejunum. No definite evidence of a focal transition point. The more distal small bowel is decompressed. 2. Cholelithiasis; gallbladder otherwise unremarkable. 3. Diffuse coronary artery calcifications seen. 4. Relatively diffuse calcification along the abdominal aorta. 5. Few tiny bilateral renal cysts seen. 6. Minimal degenerative change along the lower thoracic and lumbar spine.   Electronically Signed   By: Roanna Raider M.D.   On: 05/08/2015 00:21   Dg Abd 2 Views  05/08/2015   CLINICAL DATA:  74 year old  male NG tube placement. Initial encounter.  EXAM: ABDOMEN - 2 VIEW  COMPARISON:  CT Abdomen and Pelvis 0004 hours today, and earlier.  FINDINGS: Upright and supine views of the abdomen and pelvis. There is now a oral contrast throughout the distal small and large bowel to the level of the rectum. No pneumoperitoneum. No dilated loops. NG tube in place, tip at the level of the gastric antrum or duodenum bulb. Side hole the level of the distal stomach. Osteopenia, scoliosis, degenerative changes in the spine.  IMPRESSION: 1. NG tube placed to the distal stomach. 2. Normal bowel gas pattern with oral contrast now throughout distal small bowel and large bowel loops to the rectum. No free air.   Electronically Signed   By: Odessa Fleming M.D.   On: 05/08/2015 09:22    Scheduled Meds:  Scheduled Meds: . aspirin EC  81 mg Oral Daily  . insulin aspart  0-9 Units Subcutaneous TID WC  . pantoprazole (PROTONIX) IV  40 mg Intravenous Q1200  . sodium chloride  3 mL Intravenous Q12H   Continuous Infusions: . sodium chloride 50 mL/hr at 05/08/15 1428    Time spent on care of this patient: 35 min   Leone Putman, MD 05/08/2015, 5:37 PM  LOS: 0 days   [facility] Office  [phone] Pager - Text Page per [email or web address] If 7PM-7AM, please contact night-coverage [email or web address]

## 2015-05-08 NOTE — ED Notes (Signed)
Admitting doctor at the bedside.  The pt just vomited again  Bed changed  Liter nss infusing wo

## 2015-05-08 NOTE — H&P (Signed)
[facility] Admission History and Physical Service Pager: [phone]  Patient name: Paul Villarreal Medical record number: [id number] Date of birth: 04/11/1941 Age: 74 y.o. Gender: male  Primary Care Provider: No primary care provider on file. Consultants: General Surgery Code Status: Full  Chief Complaint: Nausea / Vomiting  Assessment and Plan: Paul Villarreal is a 74 y.o. male presenting with Nausea / Vomiting and found to have developing small bowel obstruction . PMH is significant for Recent laminectomy of L4-5, L5-S1 9/27 discharged on 9/29, HLD, DMII, GERD.   # SBO / Jejunal Dysmotility - Pt. Presenting with nausea / vomiting onset 3 days after d/c from the hospital after undergoing laminectomy. He felt fine this morning, but then received percocet for the first time since his surgery. He then developed nausea / vomiting. CT scan abdomen here with gastric / jejunal distension. He has not had fever, abdominal pain, or back pain. His incision site is C/D/I. He has never had any abdominal surgery before, nor has he had anything like this before. He is a diabetic. Vital signs stable. Labs mostly normal with slightly low Chloride which is expected. U/A normal. Troponin and EKG normal. Lipase normal. Exam is mostly benign with reduced bowel sounds. He received mag citrate in the hospital to help him stool and was sent home, I suspect his bowels had not completely recovered from anesthesia yet, and then he received narcotics at home for the first time ( he was not having pain, he misunderstood and thought he was supposed to take them every 6 hours).  - Admit to telemetry.  - General surgery consulted in the ED - NG tube to suction - Gastrograffin protocol - No urinary retention at this time - NPO - D5NS at MIVF - Will f/u results of gastrograffin and KUB - Will further intervene after conservative measures have been tried first. \ - Zofran prn for nausea.   #  HLD - continue home crestor - Daily ASA  # DMII - on metformin and glyburide at home.  - A1C - Sensitive SSI while NPO - Will adjust SSI with diet.    FEN/GI: NPO, NGT to suction, D5NS at MIVF.  Prophylaxis: SCD's, No Lovenox given recent laminectomy. Will discuss with neurosurgery prior to initiation of anticoagulant.   Disposition:  Pending resolution of SBO.   History of Present Illness:  Paul Villarreal is a 74 y.o. male presenting with nausea and vomiting for one day. He had a laminectomy of his L4,5, S1 on 9/27, and then was discharged on 9/29. He was home for two days without a problem, and then today around 2 pm he developed nausea and vomiting that is increasing in volume. He has not had a bowel movement for 1-2 days. His daughter says that he did not have any back pain after his laminectomy and did not require any pain medication. She says he was somewhat constipated, but this resolved with magnesium citrate. She says they went home and were fine for the first two days. He was able to eat and drink without difficulty. He ate breakfast this morning, and then after lunch began having nausea and vomiting. He has not had any fever or chills, no dysuria, no back pain, and no signs of infection near his incision site. He has not passed gas today he says. He denies bladder incontinence or feeling that he can't urinate. He does not have any abdominal pain. He has noticed some slight bruising on his sides, but otherwise,  has no other abnormalities. No chest pain, no shortness of breath.   Review Of Systems: Per HPI with the following additions: none Otherwise 12 point review of systems was performed and was unremarkable.  Patient Active Problem List   Diagnosis Date Noted  . Small bowel obstruction ([place]) 05/08/2015  . Lumbar stenosis 05/02/2015  . Lumbosacral radiculopathy at S1 02/22/2015  . Diabetic neuropathy ([place]) 02/22/2015  . Numbness and tingling 02/22/2015   Past Medical  History: Past Medical History  Diagnosis Date  . Diabetes mellitus without complication ([place])   . Hyperlipemia   . Heart disease   . GERD (gastroesophageal reflux disease)   . Degenerative lumbar spinal stenosis   . Coronary artery disease   . Iron deficiency   . Decreased hearing   . Lower extremity pain   . Nonintractable chronic migraine   . Myocardial infarction ([place])   . Arthritis   . Anemia    Past Surgical History: Past Surgical History  Procedure Laterality Date  . Coronary artery bypass graft      2006  . Cardiac catheterization      2006  . Lumbar laminectomy/decompression microdiscectomy Left 05/02/2015    Procedure: Bilateral Lumbar four five, Left  Lumbar five Sacral one Laminectomy;  Surgeon: Kristeen Miss, MD;  Location: [facility];  Service: Neurosurgery;  Laterality: Left;  Left L4-5 L5-S1 Laminectomy   Social History: Social History  Substance Use Topics  . Smoking status: Never Smoker   . Smokeless tobacco: None  . Alcohol Use: No   Additional social history: none  Please also refer to relevant sections of EMR.  Family History: Family History  Problem Relation Age of Onset  . Stroke Father    Allergies and Medications: No Known Allergies No current facility-administered medications on file prior to encounter.   Current Outpatient Prescriptions on File Prior to Encounter  Medication Sig Dispense Refill  . aspirin EC 81 MG tablet Take 81 mg by mouth daily.    . Blood Glucose Monitoring Suppl KIT by Does not apply route.    . Glucose Blood (ACCU-CHEK AVIVA PLUS VI) 1 strip by In Vitro route daily.    Marland Kitchen glucose blood test strip 1 each by Other route as needed for other. Use as instructed    . glyBURIDE (DIABETA) 2.5 MG tablet Take 1.25 mg by mouth every other day.     . metFORMIN (GLUCOPHAGE) 500 MG tablet Take 500 mg by mouth 2 (two) times daily with a meal.    . methocarbamol (ROBAXIN) 500 MG tablet Take 1 tablet (500 mg total) by mouth every 6  (six) hours as needed for muscle spasms. 40 tablet 3  . Multiple Vitamin (MULTIVITAMIN WITH MINERALS) TABS tablet Take 1 tablet by mouth daily. Centrum Silver    . Omega-3 Fatty Acids (FISH OIL PO) Take 1 capsule by mouth daily.    Marland Kitchen omeprazole (PRILOSEC) 40 MG capsule Take 40 mg by mouth daily.    Marland Kitchen oxyCODONE-acetaminophen (PERCOCET/ROXICET) 5-325 MG tablet Take 1-2 tablets by mouth every 4 (four) hours as needed for moderate pain. 40 tablet 0  . rosuvastatin (CRESTOR) 10 MG tablet Take 10 mg by mouth at bedtime.     . Vitamin D, Ergocalciferol, (DRISDOL) 50000 UNITS CAPS capsule Take 50,000 Units by mouth every 7 (seven) days. On Thursdays      Objective: BP 142/70 mmHg  Pulse 73  Temp(Src) 98.4 F (36.9 C) (Oral)  Resp 26  Ht 5' 4"  (1.626 m)  Wt 157 lb (71.215 kg)  BMI 26.94 kg/m2  SpO2 99% Exam: General: NAD, AAOx3, resting comfortably in bed.  Eyes: EOMI, PERRLA, NCAT ENTM: Nares patent, mouth moist and without signs of palor.  Neck: Supple, no thyromegaly, no LAD Cardiovascular: RRR, no MGR, normal S1/S2, 2+ distal pulses.  Respiratory: CTA Bilaterally, appropriate rate, unlabored.  Abdomen: S, Slightly distended, dereased bowel sounds, nontender, some bruising near the inguinal fold radiating to the back noted bilaterally. ? From surgery positioning. It is nontender.  MSK: MAEW, nontender.  Skin: Bruising as noted above not classic for Cullen or Grey Turner's signs. Likely surgical positioning.  Neuro: No focal deficits.  Psych: Appropriate mood / affect.   Labs and Imaging: CBC BMET   Recent Labs Lab 05/07/15 2212  WBC 10.4  HGB 12.3*  HCT 37.5*  PLT 358    Recent Labs Lab 05/07/15 2212  NA 129*  K 3.6  CL 93*  CO2 24  BUN 18  CREATININE 0.95  GLUCOSE 182*  CALCIUM 9.4     Troponin - 0.00  Anion Gap - 12 AST / ALT  - 24 / 17 Total Protein 8.9 Bilirubin - 0.8 Lipase 32 Albumin 3.8  Urinalysis    Component Value Date/Time   COLORURINE AMBER*  05/07/2015 2130   APPEARANCEUR TURBID* 05/07/2015 2130   LABSPEC 1.038* 05/07/2015 2130   PHURINE 6.5 05/07/2015 2130   GLUCOSEU NEGATIVE 05/07/2015 2130   HGBUR MODERATE* 05/07/2015 2130   BILIRUBINUR NEGATIVE 05/07/2015 2130   KETONESUR 15* 05/07/2015 2130   PROTEINUR 100* 05/07/2015 2130   UROBILINOGEN 1.0 05/07/2015 2130   NITRITE NEGATIVE 05/07/2015 2130   LEUKOCYTESUR NEGATIVE 05/07/2015 2130    CT Abdomen / Pelvis 10/3:  IMPRESSION: 1. Dilatation of the stomach with fluid and air. Distention of the duodenum and proximal jejunum, measuring up to 4.4 cm in diameter. The location of the mid to distal jejunum, with gradual decompression. This is thought to reflect small bowel dysmotility and partial small obstruction at the level of the jejunum. No definite evidence of a focal transition point. The more distal small bowel is decompressed. 2. Cholelithiasis; gallbladder otherwise unremarkable. 3. Diffuse coronary artery calcifications seen. 4. Relatively diffuse calcification along the abdominal aorta. 5. Few tiny bilateral renal cysts seen. 6. Minimal degenerative change along the lower thoracic and lumbar spine.     Aquilla Hacker, MD 05/08/2015, 1:54 AM PGY-2, [place] Intern pager: [phone], text pages welcome

## 2015-05-08 NOTE — Consult Note (Signed)
Reason for Consult: partial small bowel obstruction Referring Physician: Dr. Mallie Mussel   HPI: Paul Villarreal is a 74 year old male s/p lumbar laminectomy 05/02/15 admitted yesterday with abdominal pain, nausea and vomiting.  CT of A/P showed small bowel distention concerning for PSBO.  We have therefore been asked to evaluate.  Since admission, he had a NGT place and has 848m brown output.  His is passing flatus, abdominal pain has resolved.   Past Medical History  Diagnosis Date  . [place]   . Hyperlipemia   . Heart disease   . GERD (gastroesophageal reflux disease)   . Degenerative lumbar spinal stenosis   . Coronary artery disease   . Iron deficiency   . Decreased hearing   . Lower extremity pain   . Nonintractable chronic migraine   . [place]   . Arthritis   . Anemia     Past Surgical History  Procedure Laterality Date  . Coronary artery bypass graft      2006  . Cardiac catheterization      2006  . Lumbar laminectomy/decompression microdiscectomy Left 05/02/2015    Procedure: Bilateral Lumbar four five, Left  Lumbar five Sacral one Laminectomy;  Surgeon: HKristeen Miss MD;  [facility];  Service: Neurosurgery;  Laterality: Left;  Left L4-5 L5-S1 Laminectomy    Family History  Problem Relation Age of Onset  . Stroke Father     Social History:  reports that he has never smoked. He does not have any smokeless tobacco history on file. He reports that he does not drink alcohol or use illicit drugs.  Allergies: No Known Allergies  Medications:  No current facility-administered medications on file prior to encounter.   Current Outpatient Prescriptions on File Prior to Encounter  Medication Sig Dispense Refill  . aspirin EC 81 MG tablet Take 81 mg by mouth daily.    . Blood Glucose Monitoring Suppl KIT by Does not apply route.    . Glucose Blood (ACCU-CHEK AVIVA PLUS VI) 1 strip by In Vitro route  daily.    .Marland Kitchenglucose blood test strip 1 each by Other route as needed for other. Use as instructed    . glyBURIDE (DIABETA) 2.5 MG tablet Take 1.25 mg by mouth every other day.     . metFORMIN (GLUCOPHAGE) 500 MG tablet Take 500 mg by mouth 2 (two) times daily with a meal.    . methocarbamol (ROBAXIN) 500 MG tablet Take 1 tablet (500 mg total) by mouth every 6 (six) hours as needed for muscle spasms. 40 tablet 3  . Multiple Vitamin (MULTIVITAMIN WITH MINERALS) TABS tablet Take 1 tablet by mouth daily. Centrum Silver    . Omega-3 Fatty Acids (FISH OIL PO) Take 1 capsule by mouth daily.    .Marland Kitchenomeprazole (PRILOSEC) 40 MG capsule Take 40 mg by mouth daily.    .Marland KitchenoxyCODONE-acetaminophen (PERCOCET/ROXICET) 5-325 MG tablet Take 1-2 tablets by mouth every 4 (four) hours as needed for moderate pain. 40 tablet 0  . rosuvastatin (CRESTOR) 10 MG tablet Take 10 mg by mouth at bedtime.     . Vitamin D, Ergocalciferol, (DRISDOL) 50000 UNITS CAPS capsule Take 50,000 Units by mouth every 7 (seven) days. On Thursdays       Results for orders placed or performed during the hospital encounter of 05/07/15 (from the past 48 hour(s))  Urinalysis, [facility] w reflex microscopic [facility]     Status: Abnormal   Collection  Time: 05/07/15  9:30 PM  Result Value Ref Range   Color, Urine AMBER (A) YELLOW    Comment: BIOCHEMICALS MAY BE AFFECTED BY COLOR   APPearance TURBID (A) CLEAR   Specific Gravity, Urine 1.038 (H) 1.005 - 1.030   pH 6.5 5.0 - 8.0   Glucose, UA NEGATIVE NEGATIVE mg/dL   Hgb urine dipstick MODERATE (A) NEGATIVE   Bilirubin Urine NEGATIVE NEGATIVE   Ketones, ur 15 (A) NEGATIVE mg/dL   Protein, ur 100 (A) NEGATIVE mg/dL   Urobilinogen, UA 1.0 0.0 - 1.0 mg/dL   Nitrite NEGATIVE NEGATIVE   Leukocytes, UA NEGATIVE NEGATIVE  Urine microscopic-add on     Status: Abnormal   Collection Time: 05/07/15  9:30 PM  Result Value Ref Range   Squamous Epithelial / LPF FEW (A) RARE   WBC, UA 0-2 <3 WBC/hpf    RBC / HPF 11-20 <3 RBC/hpf   Bacteria, UA FEW (A) RARE   Urine-Other MUCOUS PRESENT   I-stat troponin, ED     Status: None   Collection Time: 05/07/15  9:35 PM  Result Value Ref Range   Troponin i, poc 0.00 0.00 - 0.08 ng/mL   Comment 3            Comment: Due to the release kinetics of cTnI, a negative result within the first hours of the onset of symptoms does not rule out myocardial infarction with certainty. If myocardial infarction is still suspected, repeat the test at appropriate intervals.   CBC with Differential/Platelet     Status: Abnormal   Collection Time: 05/07/15 10:12 PM  Result Value Ref Range   WBC 10.4 4.0 - 10.5 K/uL   RBC 4.47 4.22 - 5.81 MIL/uL   Hemoglobin 12.3 (L) 13.0 - 17.0 g/dL   HCT 37.5 (L) 39.0 - 52.0 %   MCV 83.9 78.0 - 100.0 fL   MCH 27.5 26.0 - 34.0 pg   MCHC 32.8 30.0 - 36.0 g/dL   RDW 13.1 11.5 - 15.5 %   Platelets 358 150 - 400 K/uL   Neutrophils Relative % 85 %   Neutro Abs 8.9 (H) 1.7 - 7.7 K/uL   Lymphocytes Relative 10 %   Lymphs Abs 1.0 0.7 - 4.0 K/uL   Monocytes Relative 5 %   Monocytes Absolute 0.5 0.1 - 1.0 K/uL   Eosinophils Relative 0 %   Eosinophils Absolute 0.0 0.0 - 0.7 K/uL   Basophils Relative 0 %   Basophils Absolute 0.0 0.0 - 0.1 K/uL  Comprehensive metabolic panel     Status: Abnormal   Collection Time: 05/07/15 10:12 PM  Result Value Ref Range   Sodium 129 (L) 135 - 145 mmol/L   Potassium 3.6 3.5 - 5.1 mmol/L   Chloride 93 (L) 101 - 111 mmol/L   CO2 24 22 - 32 mmol/L   Glucose, Bld 182 (H) 65 - 99 mg/dL   BUN 18 6 - 20 mg/dL   Creatinine, Ser 0.95 0.61 - 1.24 mg/dL   Calcium 9.4 8.9 - 10.3 mg/dL   Total Protein 8.9 (H) 6.5 - 8.1 g/dL   Albumin 3.8 3.5 - 5.0 g/dL   AST 24 15 - 41 U/L   ALT 17 17 - 63 U/L   Alkaline Phosphatase 58 38 - 126 U/L   Total Bilirubin 0.8 0.3 - 1.2 mg/dL   GFR calc non Af Amer >60 >60 mL/min   GFR calc Af Amer >60 >60 mL/min    Comment: (NOTE) The eGFR has  been calculated using the  CKD EPI equation. This calculation has not been validated in all clinical situations. eGFR's persistently <60 mL/min signify possible Chronic Kidney Disease.    Anion gap 12 5 - 15  Lipase, blood     Status: None   Collection Time: 05/07/15 10:12 PM  Result Value Ref Range   Lipase 32 22 - 51 U/L  Magnesium     Status: None   Collection Time: 05/08/15  4:57 AM  Result Value Ref Range   Magnesium 2.1 1.7 - 2.4 mg/dL  Phosphorus     Status: None   Collection Time: 05/08/15  4:57 AM  Result Value Ref Range   Phosphorus 3.8 2.5 - 4.6 mg/dL  Comprehensive metabolic panel     Status: Abnormal   Collection Time: 05/08/15  4:57 AM  Result Value Ref Range   Sodium 134 (L) 135 - 145 mmol/L   Potassium 4.0 3.5 - 5.1 mmol/L   Chloride 99 (L) 101 - 111 mmol/L   CO2 25 22 - 32 mmol/L   Glucose, Bld 179 (H) 65 - 99 mg/dL   BUN 17 6 - 20 mg/dL   Creatinine, Ser 0.95 0.61 - 1.24 mg/dL   Calcium 9.5 8.9 - 10.3 mg/dL   Total Protein 7.5 6.5 - 8.1 g/dL   Albumin 3.5 3.5 - 5.0 g/dL   AST 20 15 - 41 U/L   ALT 16 (L) 17 - 63 U/L   Alkaline Phosphatase 51 38 - 126 U/L   Total Bilirubin 1.0 0.3 - 1.2 mg/dL   GFR calc non Af Amer >60 >60 mL/min   GFR calc Af Amer >60 >60 mL/min    Comment: (NOTE) The eGFR has been calculated using the CKD EPI equation. This calculation has not been validated in all clinical situations. eGFR's persistently <60 mL/min signify possible Chronic Kidney Disease.    Anion gap 10 5 - 15  CBC     Status: Abnormal   Collection Time: 05/08/15  4:57 AM  Result Value Ref Range   WBC 9.9 4.0 - 10.5 K/uL   RBC 4.18 (L) 4.22 - 5.81 MIL/uL   Hemoglobin 11.6 (L) 13.0 - 17.0 g/dL   HCT 35.0 (L) 39.0 - 52.0 %   MCV 83.7 78.0 - 100.0 fL   MCH 27.8 26.0 - 34.0 pg   MCHC 33.1 30.0 - 36.0 g/dL   RDW 13.1 11.5 - 15.5 %   Platelets 329 150 - 400 K/uL  Glucose, capillary     Status: Abnormal   Collection Time: 05/08/15  7:33 AM  Result Value Ref Range   Glucose-Capillary 165  (H) 65 - 99 mg/dL   Comment 1 Notify RN     Ct Abdomen Pelvis W Contrast  05/08/2015   CLINICAL DATA:  Acute onset of nausea, vomiting and generalized abdominal pain. Initial encounter.  EXAM: CT ABDOMEN AND PELVIS WITH CONTRAST  TECHNIQUE: Multidetector CT imaging of the abdomen and pelvis was performed using the standard protocol following bolus administration of intravenous contrast.  CONTRAST:  168m OMNIPAQUE IOHEXOL 300 MG/ML  SOLN  COMPARISON:  MRI of the lumbar spine performed 03/26/2015  FINDINGS: The visualized lung bases are clear. The patient is status post median sternotomy. Diffuse coronary artery calcifications are seen.  A few small calcified granulomata are seen within the right hepatic lobe. The liver and spleen are otherwise unremarkable. A few stones are seen dependently within the gallbladder. The gallbladder is otherwise unremarkable. The pancreas and adrenal glands  are unremarkable.  A few tiny bilateral renal cysts are seen. The kidneys are otherwise unremarkable. Nonspecific perinephric stranding and fluid is noted bilaterally. There is no evidence of hydronephrosis. No renal or ureteral stones are identified.  There is dilatation of the stomach, filled with fluid and air. The duodenum and proximal jejunum are distended, measuring up to 4.4 cm. There is fecalization of the mid to distal jejunum, with gradual decompression. This is thought to reflect dysmotility and partial small-bowel obstruction at the level of the jejunum. There is no definite evidence of a focal transition point.  More distal small bowel loops are completely decompressed. No acute vascular abnormalities are seen. Relatively diffuse calcification is noted along the abdominal aorta.  The appendix is not definitely seen; there is no evidence for appendicitis. The colon is unremarkable in appearance.  The bladder is mildly distended and grossly remarkable. The prostate is borderline normal in size. No inguinal  lymphadenopathy is seen.  No acute osseous abnormalities are identified. Vacuum phenomenon is noted at the lower thoracic and lumbar spine. Facet disease is noted at the lower lumbar spine.  IMPRESSION: 1. Dilatation of the stomach with fluid and air. Distention of the duodenum and proximal jejunum, measuring up to 4.4 cm in diameter. The location of the mid to distal jejunum, with gradual decompression. This is thought to reflect small bowel dysmotility and partial small obstruction at the level of the jejunum. No definite evidence of a focal transition point. The more distal small bowel is decompressed. 2. Cholelithiasis; gallbladder otherwise unremarkable. 3. Diffuse coronary artery calcifications seen. 4. Relatively diffuse calcification along the abdominal aorta. 5. Few tiny bilateral renal cysts seen. 6. Minimal degenerative change along the lower thoracic and lumbar spine.   Electronically Signed   By: Garald Balding M.D.   On: 05/08/2015 00:21    Review of Systems  All other systems reviewed and are negative.  Blood pressure 137/72, pulse 75, temperature 98.6 F (37 C), temperature source Oral, resp. rate 17, height _0  (1.626 m), weight 69.8 kg (153 lb 14.1 oz), SpO2 100 %. Physical Exam  Constitutional: He is oriented to person, place, and time. He appears well-developed and well-nourished. No distress.  Cardiovascular: Normal rate, regular rhythm, normal heart sounds and intact distal pulses.  Exam reveals no gallop and no friction rub.   No murmur heard. Respiratory: Effort normal and breath sounds normal. No respiratory distress. He has no wheezes. He has no rales. He exhibits no tenderness.  GI: Soft. Bowel sounds are normal. He exhibits no distension and no mass. There is no tenderness. There is no rebound and no guarding.  Musculoskeletal: Normal range of motion. He exhibits no edema or tenderness.  Neurological: He is alert and oriented to person, place, and time.  Skin: Skin is  warm and dry. He is not diaphoretic.  Left flank ecchymosis, lower lumbar spine incision is c/d/i.   Psychiatric: He has a normal mood and affect. His behavior is normal. Judgment and thought content normal.    Assessment/Plan: S/p lumbar laminectomy 05/02/15 Post op ileus-do not think he has an obstruction, will DC SBO protocol.  Obtain AXR now.  Will consider clamping trial as he has great bowel sounds and is passing flatus.  Ambulate as tolerated and avoid narcotics. Will follow for now, thank you for the consult.   Roselynne Lortz ANP-BC 05/08/2015, 8:03 AM

## 2015-05-09 DIAGNOSIS — K5669 Other intestinal obstruction: Secondary | ICD-10-CM

## 2015-05-09 DIAGNOSIS — E871 Hypo-osmolality and hyponatremia: Secondary | ICD-10-CM

## 2015-05-09 DIAGNOSIS — E119 Type 2 diabetes mellitus without complications: Secondary | ICD-10-CM

## 2015-05-09 LAB — BASIC METABOLIC PANEL
Anion gap: 5 (ref 5–15)
BUN: 16 mg/dL (ref 6–20)
CO2: 27 mmol/L (ref 22–32)
Calcium: 8.3 mg/dL — ABNORMAL LOW (ref 8.9–10.3)
Chloride: 101 mmol/L (ref 101–111)
Creatinine, Ser: 0.98 mg/dL (ref 0.61–1.24)
GFR calc Af Amer: >60 mL/min (ref >60)
GLUCOSE: 107 mg/dL — AB (ref 65–99)
POTASSIUM: 4.1 mmol/L (ref 3.5–5.1)
Sodium: 133 mmol/L — ABNORMAL LOW (ref 135–145)

## 2015-05-09 LAB — GLUCOSE, CAPILLARY: Glucose-Capillary: 128 mg/dL — ABNORMAL HIGH (ref 65–99)

## 2015-05-09 LAB — HEMOGLOBIN A1C
Hgb A1c MFr Bld: 6.1 % — ABNORMAL HIGH (ref 4.8–5.6)
MEAN PLASMA GLUCOSE: 128 mg/dL

## 2015-05-09 NOTE — Discharge Summary (Signed)
PATIENT DETAILS Name: Paul Villarreal Age: 74 y.o. Sex: male Date of Birth: 1941-01-27 MRN: [id number]. Admitting Physician: Debbe Odea, MD PCP:No primary care provider on file.  Admit Date: 05/07/2015 Discharge date: 05/09/2015  Recommendations for Outpatient Follow-up:  1. Please repeat CBC/BMET at next visit  PRIMARY DISCHARGE DIAGNOSIS:  Principal Problem:   Small bowel obstruction ([place]) Active Problems:   Diabetic neuropathy ([place])   Type 2 diabetes mellitus without complication ([place])   Hyponatremia      PAST MEDICAL HISTORY: Past Medical History  Diagnosis Date  . Diabetes mellitus without complication ([place])   . Hyperlipemia   . Heart disease   . GERD (gastroesophageal reflux disease)   . Degenerative lumbar spinal stenosis   . Coronary artery disease   . Iron deficiency   . Decreased hearing   . Lower extremity pain   . Nonintractable chronic migraine   . Myocardial infarction ([place])   . Arthritis   . Anemia     DISCHARGE MEDICATIONS: Current Discharge Medication List    CONTINUE these medications which have NOT CHANGED   Details  aspirin EC 81 MG tablet Take 81 mg by mouth daily.    Blood Glucose Monitoring Suppl KIT by Does not apply route.    !! Glucose Blood (ACCU-CHEK AVIVA PLUS VI) 1 strip by In Vitro route daily.    !! glucose blood test strip 1 each by Other route as needed for other. Use as instructed    glyBURIDE (DIABETA) 2.5 MG tablet Take 1.25 mg by mouth every other day.     metFORMIN (GLUCOPHAGE) 500 MG tablet Take 500 mg by mouth 2 (two) times daily with a meal.    methocarbamol (ROBAXIN) 500 MG tablet Take 1 tablet (500 mg total) by mouth every 6 (six) hours as needed for muscle spasms. Qty: 40 tablet, Refills: 3    Multiple Vitamin (MULTIVITAMIN WITH MINERALS) TABS tablet Take 1 tablet by mouth daily. Centrum Silver    Omega-3 Fatty Acids (FISH OIL PO) Take 1 capsule by mouth daily.    omeprazole (PRILOSEC) 40 MG capsule Take  40 mg by mouth daily.    rosuvastatin (CRESTOR) 10 MG tablet Take 10 mg by mouth at bedtime.     Vitamin D, Ergocalciferol, (DRISDOL) 50000 UNITS CAPS capsule Take 50,000 Units by mouth every 7 (seven) days. On Thursdays     !! - Potential duplicate medications found. Please discuss with provider.    STOP taking these medications     oxyCODONE-acetaminophen (PERCOCET/ROXICET) 5-325 MG tablet         ALLERGIES:  No Known Allergies  BRIEF HPI:  See H&P, Labs, Consult and Test reports for all details in brief, patient  is a 74 y.o. male with DM, HLP, GERD, CAD s/p lumbar laminectomy on 9/29. The patient presented with nausea vomiting and abdominal pain and was found to have a CT that suggested partial small bowel obstruction  CONSULTATIONS:   general surgery  PERTINENT RADIOLOGIC STUDIES: Dg Lumbar Spine 2-3 Views  05/02/2015   CLINICAL DATA:  74 year old male for left L4 through S1 laminectomy for spinal stenosis. Initial encounter.  EXAM: LUMBAR SPINE - 2-3 VIEW  COMPARISON:  None.  FINDINGS: Two with intraoperative lateral views of the lumbar spine are submitted for review.  There are no comparison exams available. For the present examination, the last fully open disc space will be labeled the L5-S1 level.  On the film marked #1, there is a metallic probe posterior to the  mid L5 vertebral body level.  On the second film submitted for review there is a metallic probe directed towards the superior aspect of the L5 vertebral body with metallic rakes spanning between the upper L5 and upper S1 level.  Surgical instruments overlie the pelvis.  IMPRESSION: On the second film submitted for review there is a metallic probe directed towards the superior aspect of the L5 vertebral body with metallic rakes spanning between the upper L5 and upper S1 level.   Electronically Signed   By: Genia Del M.D.   On: 05/02/2015 17:22   Ct Abdomen Pelvis W Contrast  05/08/2015   CLINICAL DATA:  Acute onset  of nausea, vomiting and generalized abdominal pain. Initial encounter.  EXAM: CT ABDOMEN AND PELVIS WITH CONTRAST  TECHNIQUE: Multidetector CT imaging of the abdomen and pelvis was performed using the standard protocol following bolus administration of intravenous contrast.  CONTRAST:  155m OMNIPAQUE IOHEXOL 300 MG/ML  SOLN  COMPARISON:  MRI of the lumbar spine performed 03/26/2015  FINDINGS: The visualized lung bases are clear. The patient is status post median sternotomy. Diffuse coronary artery calcifications are seen.  A few small calcified granulomata are seen within the right hepatic lobe. The liver and spleen are otherwise unremarkable. A few stones are seen dependently within the gallbladder. The gallbladder is otherwise unremarkable. The pancreas and adrenal glands are unremarkable.  A few tiny bilateral renal cysts are seen. The kidneys are otherwise unremarkable. Nonspecific perinephric stranding and fluid is noted bilaterally. There is no evidence of hydronephrosis. No renal or ureteral stones are identified.  There is dilatation of the stomach, filled with fluid and air. The duodenum and proximal jejunum are distended, measuring up to 4.4 cm. There is fecalization of the mid to distal jejunum, with gradual decompression. This is thought to reflect dysmotility and partial small-bowel obstruction at the level of the jejunum. There is no definite evidence of a focal transition point.  More distal small bowel loops are completely decompressed. No acute vascular abnormalities are seen. Relatively diffuse calcification is noted along the abdominal aorta.  The appendix is not definitely seen; there is no evidence for appendicitis. The colon is unremarkable in appearance.  The bladder is mildly distended and grossly remarkable. The prostate is borderline normal in size. No inguinal lymphadenopathy is seen.  No acute osseous abnormalities are identified. Vacuum phenomenon is noted at the lower thoracic and  lumbar spine. Facet disease is noted at the lower lumbar spine.  IMPRESSION: 1. Dilatation of the stomach with fluid and air. Distention of the duodenum and proximal jejunum, measuring up to 4.4 cm in diameter. The location of the mid to distal jejunum, with gradual decompression. This is thought to reflect small bowel dysmotility and partial small obstruction at the level of the jejunum. No definite evidence of a focal transition point. The more distal small bowel is decompressed. 2. Cholelithiasis; gallbladder otherwise unremarkable. 3. Diffuse coronary artery calcifications seen. 4. Relatively diffuse calcification along the abdominal aorta. 5. Few tiny bilateral renal cysts seen. 6. Minimal degenerative change along the lower thoracic and lumbar spine.   Electronically Signed   By: JGarald BaldingM.D.   On: 05/08/2015 00:21   Dg Abd 2 Views  05/08/2015   CLINICAL DATA:  74year old male NG tube placement. Initial encounter.  EXAM: ABDOMEN - 2 VIEW  COMPARISON:  CT Abdomen and Pelvis 0004 hours today, and earlier.  FINDINGS: Upright and supine views of the abdomen and pelvis. There is now a  oral contrast throughout the distal small and large bowel to the level of the rectum. No pneumoperitoneum. No dilated loops. NG tube in place, tip at the level of the gastric antrum or duodenum bulb. Side hole the level of the distal stomach. Osteopenia, scoliosis, degenerative changes in the spine.  IMPRESSION: 1. NG tube placed to the distal stomach. 2. Normal bowel gas pattern with oral contrast now throughout distal small bowel and large bowel loops to the rectum. No free air.   Electronically Signed   By: Genevie Ann M.D.   On: 05/08/2015 09:22     PERTINENT LAB RESULTS: CBC:  Recent Labs  05/07/15 2212 05/08/15 0457  WBC 10.4 9.9  HGB 12.3* 11.6*  HCT 37.5* 35.0*  PLT 358 329   CMET CMP     Component Value Date/Time   NA 133* 05/09/2015 0511   K 4.1 05/09/2015 0511   CL 101 05/09/2015 0511   CO2 27  05/09/2015 0511   GLUCOSE 107* 05/09/2015 0511   BUN 16 05/09/2015 0511   CREATININE 0.98 05/09/2015 0511   CALCIUM 8.3* 05/09/2015 0511   PROT 7.5 05/08/2015 0457   ALBUMIN 3.5 05/08/2015 0457   AST 20 05/08/2015 0457   ALT 16* 05/08/2015 0457   ALKPHOS 51 05/08/2015 0457   BILITOT 1.0 05/08/2015 0457   GFRNONAA >60 05/09/2015 0511   GFRAA >60 05/09/2015 0511    GFR Estimated Creatinine Clearance: 55.4 mL/min (by C-G formula based on Cr of 0.98).  Recent Labs  05/07/15 2212  LIPASE 32   No results for input(s): CKTOTAL, CKMB, CKMBINDEX, TROPONINI in the last 72 hours. Invalid input(s): POCBNP No results for input(s): DDIMER in the last 72 hours.  Recent Labs  05/08/15 0457  HGBA1C 6.1*   No results for input(s): CHOL, HDL, LDLCALC, TRIG, CHOLHDL, LDLDIRECT in the last 72 hours. No results for input(s): TSH, T4TOTAL, T3FREE, THYROIDAB in the last 72 hours.  Invalid input(s): FREET3 No results for input(s): VITAMINB12, FOLATE, FERRITIN, TIBC, IRON, RETICCTPCT in the last 72 hours. Coags: No results for input(s): INR in the last 72 hours.  Invalid input(s): PT Microbiology: Recent Results (from the past 240 hour(s))  Surgical pcr screen     Status: Abnormal   Collection Time: 05/02/15  1:35 PM  Result Value Ref Range Status   MRSA, PCR NEGATIVE NEGATIVE Final   Staphylococcus aureus POSITIVE (A) NEGATIVE Final    Comment:        The Xpert SA Assay (FDA approved for NASAL specimens in patients over 32 years of age), is one component of a comprehensive surveillance program.  Test performance has been validated by [facility] for patients greater than or equal to 58 year old. It is not intended to diagnose infection nor to guide or monitor treatment.      BRIEF HOSPITAL COURSE:   Principal Problem:  Partial Small bowel obstruction (HCC):admitted and given supportive measures, kept NPO, NGT placed. Rapidly improved, NGT removed and diet was advanced.  Patient had 2 BM's yesterday. By day of discharge tolerating regular diet without any abdominal pain or nausea/vomiting. Discharged home in a stable condition.  Active Problems: Hyponatremia:secondary to dehydration, much improved at time of discharge. Please recheck repeat BMET with PCP next week.  Type 2 DM:CBG's stable with SSI while inpatient, resume Metformin and Glipizide on discharge  Dyslipidemia:continue statin.   Recent lumbar laminectomy:The patient actually states that he was not having significant pain postprocedure and does not need to take further  narcotics  Anemia:secondary to chronic disease:stable for outpatient workup  TODAY-DAY OF DISCHARGE:  Subjective:   Paul Villarreal today has no headache,no chest abdominal pain,no new weakness tingling or numbness, feels much better wants to go home today.   Objective:   Blood pressure 120/70, pulse 66, temperature 98.1 F (36.7 C), temperature source Oral, resp. rate 19, height _0  (1.626 m), weight 70.2 kg (154 lb 12.2 oz), SpO2 99 %.  Intake/Output Summary (Last 24 hours) at 05/09/15 0927 Last data filed at 05/09/15 0925  Gross per 24 hour  Intake   1820 ml  Output      0 ml  Net   1820 ml   Filed Weights   05/07/15 2117 05/08/15 0229 05/09/15 0420  Weight: 71.215 kg (157 lb) 69.8 kg (153 lb 14.1 oz) 70.2 kg (154 lb 12.2 oz)    Exam Awake Alert, Oriented *3, No new F.N deficits, Normal affect [place].AT,PERRAL Supple Neck,No JVD, No cervical lymphadenopathy appriciated.  Symmetrical Chest wall movement, Good air movement bilaterally, CTAB RRR,No Gallops,Rubs or new Murmurs, No Parasternal Heave +ve B.Sounds, Abd Soft, Non tender, No organomegaly appriciated, No rebound -guarding or rigidity. No Cyanosis, Clubbing or edema, No new Rash or bruise  DISCHARGE CONDITION: Stable  DISPOSITION: Home  DISCHARGE INSTRUCTIONS:    Activity:  As tolerated  Get Medicines reviewed and adjusted: Please take all your  medications with you for your next visit with your Primary MD  Please request your Primary MD to go over all hospital tests and procedure/radiological results at the follow up, please ask your Primary MD to get all Hospital records sent to his/her office.  If you experience worsening of your admission symptoms, develop shortness of breath, life threatening emergency, suicidal or homicidal thoughts you must seek medical attention immediately by calling 911 or calling your MD immediately  if symptoms less severe.  You must read complete instructions/literature along with all the possible adverse reactions/side effects for all the Medicines you take and that have been prescribed to you. Take any new Medicines after you have completely understood and accpet all the possible adverse reactions/side effects.   Do not drive when taking Pain medications.   Do not take more than prescribed Pain, Sleep and Anxiety Medications  Special Instructions: If you have smoked or chewed Tobacco  in the last 2 yrs please stop smoking, stop any regular Alcohol  and or any Recreational drug use.  Wear Seat belts while driving.  Please note  You were cared for by a hospitalist during your hospital stay. Once you are discharged, your primary care physician will handle any further medical issues. Please note that NO REFILLS for any discharge medications will be authorized once you are discharged, as it is imperative that you return to your primary care physician (or establish a relationship with a primary care physician if you do not have one) for your aftercare needs so that they can reassess your need for medications and monitor your lab values.   Diet recommendation: Diabetic Diet Heart Healthy diet  Discharge Instructions    Call MD for:  persistant nausea and vomiting    Complete by:  As directed      Call MD for:  redness, tenderness, or signs of infection (pain, swelling, redness, odor or green/yellow  discharge around incision site)    Complete by:  As directed      Diet - low sodium heart healthy    Complete by:  As directed  Diet Carb Modified    Complete by:  As directed      Increase activity slowly    Complete by:  As directed            Follow-up Information    Schedule an appointment as soon as possible for a visit in 1 week to follow up.   Contact information:   PRIMARY MD     Total Time spent on discharge equals 25 minutes.  SignedOren Binet 05/09/2015 9:27 AM

## 2016-12-05 ENCOUNTER — Telehealth: Payer: Self-pay | Admitting: Neurology

## 2016-12-05 NOTE — Telephone Encounter (Signed)
Pt's daughter called said he fell about 1 mth ago, passed out, incontinent. He has seen PCP and was suggested he see a neurologist. He was seen by Dr Pearlean BrownieSethi in 2016 for lumbar problems. I have explained that Dr Pearlean BrownieSethi is seeing only stroke patients. She is not accepting this and is insistent Dr Pearlean BrownieSethi see her father. Please advise

## 2016-12-05 NOTE — Telephone Encounter (Signed)
Rn call patients daughter back about father need to be seen by Dr. Pearlean BrownieSethi. Pts daughter stated her father was out of the country and fell a month ago.Marland Kitchen. He was also incontinent. Rn explain that per the notes Dr. Pearlean BrownieSethi saw pt for lumbar problems, and pt had surgery in 04/2016 on his spine. Rn stated Dr.Sethi works at the hospital every other week rotating to the office. Rn stated pt needs to see PCP for evaluation if its a neurological issue. Pts daughter stated her father did see PCP, and they recommend the neurologist. Rn stated her PCP will have to do a referral of notes on the patient. PTs daughter will call the PCP to get the referral to [facility]. Rn stated the patients issue for passing is a different issue.

## 2016-12-05 NOTE — Telephone Encounter (Signed)
Katrina can you discuss this with Dr. Pearlean BrownieSethi.

## 2016-12-25 ENCOUNTER — Encounter: Payer: Self-pay | Admitting: Gastroenterology

## 2017-01-09 ENCOUNTER — Ambulatory Visit (INDEPENDENT_AMBULATORY_CARE_PROVIDER_SITE_OTHER): Payer: Medicaid Other | Admitting: Physician Assistant

## 2017-01-09 ENCOUNTER — Encounter: Payer: Self-pay | Admitting: Physician Assistant

## 2017-01-09 VITALS — BP 140/76 | HR 62 | Ht 65.0 in | Wt 153.0 lb

## 2017-01-09 DIAGNOSIS — K219 Gastro-esophageal reflux disease without esophagitis: Secondary | ICD-10-CM

## 2017-01-09 DIAGNOSIS — R6881 Early satiety: Secondary | ICD-10-CM

## 2017-01-09 DIAGNOSIS — R142 Eructation: Secondary | ICD-10-CM

## 2017-01-09 DIAGNOSIS — R630 Anorexia: Secondary | ICD-10-CM | POA: Diagnosis not present

## 2017-01-09 MED ORDER — OMEPRAZOLE 40 MG PO CPDR: DELAYED_RELEASE_CAPSULE | ORAL | 11 refills | Status: DC

## 2017-01-09 NOTE — Patient Instructions (Addendum)
Please call us when you are back from your holiday.   Call Tonio Seider at [phone].   We can then schedule the Endoscopy and then the Gastric Emptying scan.   We sent a prescription to [facility]. For Pantoprazoe Sodium 40 mg.

## 2017-01-09 NOTE — Progress Notes (Signed)
Subjective:    Patient ID: Paul Villarreal, male    DOB: August 07, 1940, 76 y.o.   MRN: [id number]  HPI Paul Villarreal is a pleasant 76 year old Paul Villarreal male who speaks little [place]. He is accompanied by his daughter who is a physician and interprets for him. He is referred by Dr. Emogene Villarreal for evaluation of chronic reflux and complaints of upper abdominal fullness and lack of appetite. Patient has history of adult-onset diabetes mellitus with neuropathy and has been diabetic for 25 years managed on oral agents. He has history of coronary artery disease status post previous CABG and lumbar disc disease with radiculopathy. Apparently has had some previous GI evaluation done in [place] 8 or 9 years ago. Patient states that he had colonoscopy at that time which was negative is unclear whether or not he had EGD. Patient has been on omeprazole 40 mg by mouth daily for years and states that this generally works well to help control his reflux symptoms and he has no complaints of dysphagia or odynophagia but has been having an increase in sour brash over the past several months. He says he rarely is hungry and eats because he needs to. His weight has been stable. He complains of feeling full all the time and has had a lot of burping and belching. Bowel movements have been normal no melena or hematochezia. Family history negative for colon cancer and polyps. He has not had any recent imaging  Review of Systems Pertinent positive and negative review of systems were noted in the above HPI section.  All other review of systems was otherwise negative.  Outpatient Encounter Prescriptions as of 01/09/2017  Medication Sig  . aspirin EC 81 MG tablet Take 81 mg by mouth daily.  . Blood Glucose Monitoring Suppl KIT by Does not apply route.  . gabapentin (NEURONTIN) 300 MG capsule Take 300 mg by mouth 2 (two) times daily.  . Glucose Blood (ACCU-CHEK AVIVA PLUS VI) 1 strip by In Vitro route daily.  Marland Kitchen glucose blood test  strip 1 each by Other route as needed for other. Use as instructed  . glyBURIDE (DIABETA) 2.5 MG tablet Take 1.25 mg by mouth every other day.   . metFORMIN (GLUCOPHAGE) 500 MG tablet Take 500 mg by mouth 2 (two) times daily with a meal.  . Multiple Vitamin (MULTIVITAMIN WITH MINERALS) TABS tablet Take 1 tablet by mouth daily. Centrum Silver  . Omega-3 Fatty Acids (FISH OIL PO) Take 1 capsule by mouth daily.  Marland Kitchen omeprazole (PRILOSEC) 40 MG capsule Take 1 tab twice daily.  . rosuvastatin (CRESTOR) 10 MG tablet Take 10 mg by mouth at bedtime.   . Vitamin D, Ergocalciferol, (DRISDOL) 50000 UNITS CAPS capsule Take 50,000 Units by mouth every 7 (seven) days. On Thursdays  . [DISCONTINUED] omeprazole (PRILOSEC) 40 MG capsule Take 40 mg by mouth daily.  . [DISCONTINUED] methocarbamol (ROBAXIN) 500 MG tablet Take 1 tablet (500 mg total) by mouth every 6 (six) hours as needed for muscle spasms.   No facility-administered encounter medications on file as of 01/09/2017.    No Known Allergies Patient Active Problem List   Diagnosis Date Noted  . Small bowel obstruction ([place]) 05/08/2015  . Hyponatremia 05/08/2015  . Type 2 diabetes mellitus without complication ([place])   . Lumbar stenosis 05/02/2015  . Lumbosacral radiculopathy at S1 02/22/2015  . Diabetic neuropathy ([place]) 02/22/2015  . Numbness and tingling 02/22/2015   Social History   Social History  . Marital status: Married    Spouse  name: N/A  . Number of children: 3  . Years of education: N/A   Occupational History  . retired    Social History Main Topics  . Smoking status: Never Smoker  . Smokeless tobacco: Never Used  . Alcohol use Not on file  . Drug use: No  . Sexual activity: Not on file   Other Topics Concern  . Not on file   Social History Narrative   Lives with daughter and wife   2 cups of caffeine a day        [name] family history includes Stroke in his father.      Objective:    Vitals:   01/09/17  1402  BP: 140/76  Pulse: 62    Physical Exam well-developed older Paul Villarreal male, accompanied by his daughter who does interpretation and does most of this leaking. Blood pressure 140/76 pulse 62, BMI 25.4. HEENT ;nontraumatic normocephalic EOMI PERRLA sclera anicteric, Cardiovascular; regular rate and rhythm with S1-S2 no murmur or gallop, Pulmonary clear bilaterally, Abdomen; soft nontender nondistended bowel sounds are active no palpable mass or hepatosplenomegaly, Rectal; exam not done, Extremities ;no clubbing cyanosis or edema skin warm and dry, Neuropsych; mood and affect appropriate       Assessment & Plan:   #76 76 year old Paul Villarreal male with chronic GERD, on PPI therapy times many years with increase in reflux symptoms, sour brash and belching #2 early satiety-3 years-rule out diabetic gastroparesis #3 coronary artery disease status post CABG #4 adult-onset diabetes mellitus with neuropathy # 5 colon cancer surveillance negative colonoscopy 2010 by patient report  Plan; strict antireflux regimen Increase omeprazole to 40 mg by mouth twice a day before meals breakfast and before meals dinner We'll schedule for EGD with Dr. Loletha Villarreal. Procedure discussed in detail with the patient and his daughter including risks and benefits and they're agreeable to proceed We will also schedule for gastric emptying scan. Patient's daughter asked questions about the gastric emptying scan and management of gastroparesis. Discussed dietary management and use of metoclopramide and possibly domperidone. All questions were answered. Further recommendations pending results of above.  Paul Villarreal S Paul Miotke PA-C 01/09/2017   Cc: No ref. provider found

## 2017-01-14 NOTE — Progress Notes (Signed)
Thank you for sending this case to me. I have reviewed the entire note, and the outlined plan seems appropriate.   Clever Geraldo Danis, MD  

## 2017-01-17 ENCOUNTER — Encounter: Payer: Medicaid Other | Admitting: Gastroenterology

## 2017-01-24 ENCOUNTER — Ambulatory Visit: Payer: Medicaid Other | Admitting: Gastroenterology

## 2017-03-06 ENCOUNTER — Ambulatory Visit (AMBULATORY_SURGERY_CENTER): Payer: Self-pay

## 2017-03-06 VITALS — Ht 65.0 in | Wt 154.6 lb

## 2017-03-06 DIAGNOSIS — K219 Gastro-esophageal reflux disease without esophagitis: Secondary | ICD-10-CM

## 2017-03-06 NOTE — Progress Notes (Signed)
Per pt's daughter, no allergies to soy or egg products.Pt not taking any weight loss meds or using  O2 at home.  Per daughter , pt will watch Endoscopy video at home.  "Tayyba"-(daughter)  was with the pt for the pre-visit to help with paperwork, instructions and answer questions.  The daughter will be with the pt on 03/13/17 for his endoscopy, to help interpret.

## 2017-03-12 DIAGNOSIS — E119 Type 2 diabetes mellitus without complications: Secondary | ICD-10-CM | POA: Diagnosis not present

## 2017-03-12 DIAGNOSIS — E1142 Type 2 diabetes mellitus with diabetic polyneuropathy: Secondary | ICD-10-CM | POA: Diagnosis not present

## 2017-03-12 DIAGNOSIS — E785 Hyperlipidemia, unspecified: Secondary | ICD-10-CM | POA: Diagnosis not present

## 2017-03-13 ENCOUNTER — Encounter: Payer: Self-pay | Admitting: Gastroenterology

## 2017-03-13 ENCOUNTER — Ambulatory Visit (AMBULATORY_SURGERY_CENTER): Payer: Medicare Other | Admitting: Gastroenterology

## 2017-03-13 VITALS — BP 109/63 | HR 63 | Temp 97.8°F | Resp 26 | Ht 65.0 in | Wt 153.0 lb

## 2017-03-13 DIAGNOSIS — R6881 Early satiety: Secondary | ICD-10-CM | POA: Diagnosis not present

## 2017-03-13 DIAGNOSIS — R1013 Epigastric pain: Secondary | ICD-10-CM

## 2017-03-13 DIAGNOSIS — R140 Abdominal distension (gaseous): Secondary | ICD-10-CM

## 2017-03-13 DIAGNOSIS — K219 Gastro-esophageal reflux disease without esophagitis: Secondary | ICD-10-CM

## 2017-03-13 DIAGNOSIS — K2950 Unspecified chronic gastritis without bleeding: Secondary | ICD-10-CM | POA: Diagnosis not present

## 2017-03-13 DIAGNOSIS — I4891 Unspecified atrial fibrillation: Secondary | ICD-10-CM | POA: Diagnosis not present

## 2017-03-13 DIAGNOSIS — E119 Type 2 diabetes mellitus without complications: Secondary | ICD-10-CM | POA: Diagnosis not present

## 2017-03-13 DIAGNOSIS — Z951 Presence of aortocoronary bypass graft: Secondary | ICD-10-CM | POA: Diagnosis not present

## 2017-03-13 DIAGNOSIS — I2510 Atherosclerotic heart disease of native coronary artery without angina pectoris: Secondary | ICD-10-CM | POA: Diagnosis not present

## 2017-03-13 MED ORDER — SODIUM CHLORIDE 0.9 % IV SOLN: 500.0000 mL | INTRAVENOUS | Status: DC

## 2017-03-13 NOTE — Progress Notes (Signed)
Interpreter used today at the [facility] for this pt.  Interpreter's name is- 

## 2017-03-13 NOTE — Op Note (Signed)
[facility] Patient Name: Paul Villarreal Procedure Date: 03/13/2017 10:15 AM MRN: [id number] Endoscopist: Sherilyn CooterHenry L. Myrtie Neitheranis , MD Age: 76 Referring MD:  Date of Birth: 02/04/1941 Gender: Male Account #: [id number] Procedure:                Upper GI endoscopy Indications:              Epigastric abdominal pain, Gastro-esophageal reflux                            disease, Abdominal bloating, Early satiety Medicines:                Monitored Anesthesia Care Procedure:                Pre-Anesthesia Assessment:                           - Prior to the procedure, a History and Physical                            was performed, and patient medications and                            allergies were reviewed. The patient's tolerance of                            previous anesthesia was also reviewed. The risks                            and benefits of the procedure and the sedation                            options and risks were discussed with the patient.                            All questions were answered, and informed consent                            was obtained. Anticoagulants: The patient has taken                            aspirin. It was decided not to withhold this                            medication prior to the procedure. ASA Grade                            Assessment: III - A patient with severe systemic                            disease. After reviewing the risks and benefits,                            the patient was deemed in satisfactory condition to  undergo the procedure.                           After obtaining informed consent, the endoscope was                            passed under direct vision. Throughout the                            procedure, the patient's blood pressure, pulse, and                            oxygen saturations were monitored continuously. The                            Model GIF-[id number])  scope was introduced                            through the mouth, and advanced to the second part                            of duodenum. The upper GI endoscopy was                            accomplished without difficulty. The patient                            tolerated the procedure well. Scope In: Scope Out: Findings:                 The esophagus was normal.                           The entire examined stomach was normal. Biopsies                            were taken with a cold forceps for histology.                           The cardia and gastric fundus were normal on                            retroflexion.                           The examined duodenum was normal. Complications:            No immediate complications. Estimated Blood Loss:     Estimated blood loss: none. Impression:               - Normal esophagus.                           - Normal stomach. Biopsied.                           - Normal examined duodenum. Recommendation:           -  Patient has a contact number available for                            emergencies. The signs and symptoms of potential                            delayed complications were discussed with the                            patient. Return to normal activities tomorrow.                            Written discharge instructions were provided to the                            patient                           - Gastroparesis diet for 4 weeks.                           - Patient will give further consideration to a                            gastric emptying study.                           - Continue present medications.                           - Await pathology results. Ethelmae Ringel L. Myrtie Neither, MD 03/13/2017 10:38:14 AM This report has been signed electronically.

## 2017-03-13 NOTE — Patient Instructions (Signed)
Discharge  Instructions given. Handout on a Gastroproparesis diet. Resume previous medications. YOU HAD AN ENDOSCOPIC PROCEDURE TODAY AT THE [facility]:   Refer to the procedure report that was given to you for any specific questions about what was found during the examination.  If the procedure report does not answer your questions, please call your gastroenterologist to clarify.  If you requested that your care partner not be given the details of your procedure findings, then the procedure report has been included in a sealed envelope for you to review at your convenience later.  YOU SHOULD EXPECT: Some feelings of bloating in the abdomen. Passage of more gas than usual.  Walking can help get rid of the air that was put into your GI tract during the procedure and reduce the bloating. If you had a lower endoscopy (such as a colonoscopy or flexible sigmoidoscopy) you may notice spotting of blood in your stool or on the toilet paper. If you underwent a bowel prep for your procedure, you may not have a normal bowel movement for a few days.  Please Note:  You might notice some irritation and congestion in your nose or some drainage.  This is from the oxygen used during your procedure.  There is no need for concern and it should clear up in a day or so.  SYMPTOMS TO REPORT IMMEDIATELY:    Following upper endoscopy (EGD)  Vomiting of blood or coffee ground material  New chest pain or pain under the shoulder blades  Painful or persistently difficult swallowing  New shortness of breath  Fever of 100F or higher  Black, tarry-looking stools  For urgent or emergent issues, a gastroenterologist can be reached at any hour by calling [phone].   DIET:  We do recommend a small meal at first, but then you may proceed to your regular diet.  Drink plenty of fluids but you should avoid alcoholic beverages for 24 hours.  ACTIVITY:  You should plan to take it easy for the rest of today and  you should NOT DRIVE or use heavy machinery until tomorrow (because of the sedation medicines used during the test).    FOLLOW UP: Our staff will call the number listed on your records the next business day following your procedure to check on you and address any questions or concerns that you may have regarding the information given to you following your procedure. If we do not reach you, we will leave a message.  However, if you are feeling well and you are not experiencing any problems, there is no need to return our call.  We will assume that you have returned to your regular daily activities without incident.  If any biopsies were taken you will be contacted by phone or by letter within the next 1-3 weeks.  Please call us at [phone] if you have not heard about the biopsies in 3 weeks.    SIGNATURES/CONFIDENTIALITY: You and/or your care partner have signed paperwork which will be entered into your electronic medical record.  These signatures attest to the fact that that the information above on your After Visit Summary has been reviewed and is understood.  Full responsibility of the confidentiality of this discharge information lies with you and/or your care-partner.

## 2017-03-13 NOTE — Progress Notes (Signed)
Report to PACU, RN, vss, BBS= Clear.  

## 2017-03-13 NOTE — Progress Notes (Signed)
Called to room to assist during endoscopic procedure.  Patient ID and intended procedure confirmed with present staff. Received instructions for my participation in the procedure from the performing physician.  

## 2017-03-13 NOTE — Progress Notes (Signed)
Pt's states no medical or surgical changes since previsit or office visit. 

## 2017-03-14 ENCOUNTER — Other Ambulatory Visit: Payer: Self-pay

## 2017-03-14 ENCOUNTER — Telehealth: Payer: Self-pay

## 2017-03-14 DIAGNOSIS — R140 Abdominal distension (gaseous): Secondary | ICD-10-CM

## 2017-03-14 DIAGNOSIS — R6881 Early satiety: Secondary | ICD-10-CM

## 2017-03-14 NOTE — Telephone Encounter (Signed)
  Follow up Call-  Call back number 03/13/2017  Post procedure Call Back phone  # [phone], daughters cell phone  Permission to leave phone message Yes     Patient questions:  Do you have a fever, pain , or abdominal swelling? No. Pain Score  0 *  Have you tolerated food without any problems? Yes.    Have you been able to return to your normal activities? Yes.    Do you have any questions about your discharge instructions: Diet   No. Medications  No. Follow up visit  No.  Do you have questions or concerns about your Care? No.  Actions: * If pain score is 4 or above: No action needed, pain <4.

## 2017-03-21 DIAGNOSIS — H35343 Macular cyst, hole, or pseudohole, bilateral: Secondary | ICD-10-CM | POA: Diagnosis not present

## 2017-03-21 DIAGNOSIS — E119 Type 2 diabetes mellitus without complications: Secondary | ICD-10-CM | POA: Diagnosis not present

## 2017-03-24 DIAGNOSIS — I10 Essential (primary) hypertension: Secondary | ICD-10-CM | POA: Diagnosis not present

## 2017-03-24 DIAGNOSIS — M542 Cervicalgia: Secondary | ICD-10-CM | POA: Diagnosis not present

## 2017-03-24 DIAGNOSIS — D649 Anemia, unspecified: Secondary | ICD-10-CM | POA: Diagnosis not present

## 2017-03-25 ENCOUNTER — Ambulatory Visit (HOSPITAL_COMMUNITY): Payer: Medicare Other

## 2017-03-25 DIAGNOSIS — D649 Anemia, unspecified: Secondary | ICD-10-CM | POA: Diagnosis not present

## 2017-03-25 DIAGNOSIS — D529 Folate deficiency anemia, unspecified: Secondary | ICD-10-CM | POA: Diagnosis not present

## 2017-03-27 DIAGNOSIS — Z23 Encounter for immunization: Secondary | ICD-10-CM | POA: Diagnosis not present

## 2018-01-19 DIAGNOSIS — R079 Chest pain, unspecified: Secondary | ICD-10-CM | POA: Diagnosis not present

## 2018-02-11 DIAGNOSIS — R1013 Epigastric pain: Secondary | ICD-10-CM | POA: Diagnosis not present

## 2018-02-11 DIAGNOSIS — R351 Nocturia: Secondary | ICD-10-CM | POA: Diagnosis not present

## 2018-02-11 DIAGNOSIS — E119 Type 2 diabetes mellitus without complications: Secondary | ICD-10-CM | POA: Diagnosis not present

## 2018-02-12 DIAGNOSIS — R351 Nocturia: Secondary | ICD-10-CM | POA: Diagnosis not present

## 2018-02-12 DIAGNOSIS — E119 Type 2 diabetes mellitus without complications: Secondary | ICD-10-CM | POA: Diagnosis not present

## 2018-02-12 DIAGNOSIS — R1013 Epigastric pain: Secondary | ICD-10-CM | POA: Diagnosis not present

## 2018-02-13 DIAGNOSIS — H35343 Macular cyst, hole, or pseudohole, bilateral: Secondary | ICD-10-CM | POA: Diagnosis not present

## 2018-02-13 DIAGNOSIS — E113212 Type 2 diabetes mellitus with mild nonproliferative diabetic retinopathy with macular edema, left eye: Secondary | ICD-10-CM | POA: Diagnosis not present

## 2018-02-18 ENCOUNTER — Other Ambulatory Visit: Payer: Self-pay | Admitting: Internal Medicine

## 2018-02-18 DIAGNOSIS — E1143 Type 2 diabetes mellitus with diabetic autonomic (poly)neuropathy: Secondary | ICD-10-CM

## 2018-02-18 DIAGNOSIS — K3184 Gastroparesis: Principal | ICD-10-CM

## 2018-02-20 DIAGNOSIS — H33193 Other retinoschisis and retinal cysts, bilateral: Secondary | ICD-10-CM | POA: Diagnosis not present

## 2018-02-20 DIAGNOSIS — H4323 Crystalline deposits in vitreous body, bilateral: Secondary | ICD-10-CM | POA: Diagnosis not present

## 2018-02-20 DIAGNOSIS — E113293 Type 2 diabetes mellitus with mild nonproliferative diabetic retinopathy without macular edema, bilateral: Secondary | ICD-10-CM | POA: Diagnosis not present

## 2018-02-26 ENCOUNTER — Ambulatory Visit (HOSPITAL_COMMUNITY)
Admission: RE | Admit: 2018-02-26 | Discharge: 2018-02-26 | Disposition: A | Discharge status: Home / Self Care | Payer: Medicare Other | Source: Ambulatory Visit | Attending: Internal Medicine | Admitting: Internal Medicine

## 2018-02-26 DIAGNOSIS — E1143 Type 2 diabetes mellitus with diabetic autonomic (poly)neuropathy: Secondary | ICD-10-CM | POA: Insufficient documentation

## 2018-02-26 DIAGNOSIS — K3184 Gastroparesis: Secondary | ICD-10-CM

## 2018-02-26 DIAGNOSIS — E119 Type 2 diabetes mellitus without complications: Secondary | ICD-10-CM | POA: Diagnosis not present

## 2018-02-26 IMAGING — NM NM GASTRIC EMPTYING
3 series · 3 of 3 positions shown · non-contrast
Comparison: None.

CLINICAL DATA: Chronic epigastric pain.  Diabetes mellitus.

EXAM:
NUCLEAR MEDICINE GASTRIC EMPTYING SCAN
TECHNIQUE: After oral ingestion of radiolabeled meal, sequential abdominal
images were obtained for 120 minutes. Residual percentage of
activity remaining within the stomach was calculated at 60 and 120
minutes.
RADIOPHARMACEUTICALS:  2.2 mCi [MU] sulfur colloid in standardized
meal including egg

[Series 1: 0 min · 4.14mm/px · 1 of 1 slices shown]
[im 1/1]
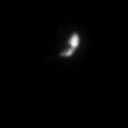

[Series 1: 1 hr · 4.14mm/px · 1 of 1 slices shown]
[im 1/1]
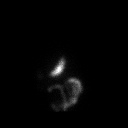

[Series 2: 2 hr · 4.14mm/px · 1 of 1 slices shown]
[im 1/1]
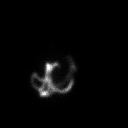

[3 of 3 positions shown; findings below may reference images not displayed]

FINDINGS: Expected location of the stomach in the left upper quadrant.
Ingested meal empties the stomach gradually over the course of the
study with 55% retention at 60 min and 2% retention at 120 min
(normal retention less than 30% at a 120 min).
IMPRESSION: Normal gastric emptying study.

## 2018-02-26 MED ORDER — TECHNETIUM TC 99M SULFUR COLLOID
2.2000 | Freq: Once | INTRAVENOUS | Status: AC | PRN
  Administered 2018-02-26: 2.2 via ORAL

## 2018-03-16 ENCOUNTER — Ambulatory Visit (INDEPENDENT_AMBULATORY_CARE_PROVIDER_SITE_OTHER): Payer: Medicaid Other | Admitting: Internal Medicine

## 2018-03-26 ENCOUNTER — Ambulatory Visit (INDEPENDENT_AMBULATORY_CARE_PROVIDER_SITE_OTHER): Payer: Medicare Other | Admitting: Internal Medicine

## 2018-03-26 ENCOUNTER — Encounter (INDEPENDENT_AMBULATORY_CARE_PROVIDER_SITE_OTHER): Payer: Self-pay | Admitting: Internal Medicine

## 2018-03-26 VITALS — BP 130/70 | HR 64 | Temp 97.9°F | Resp 18 | Ht 65.0 in | Wt 161.9 lb

## 2018-03-26 DIAGNOSIS — R630 Anorexia: Secondary | ICD-10-CM

## 2018-03-26 DIAGNOSIS — R140 Abdominal distension (gaseous): Secondary | ICD-10-CM | POA: Diagnosis not present

## 2018-03-26 DIAGNOSIS — K219 Gastro-esophageal reflux disease without esophagitis: Secondary | ICD-10-CM | POA: Diagnosis not present

## 2018-03-26 DIAGNOSIS — H903 Sensorineural hearing loss, bilateral: Secondary | ICD-10-CM | POA: Diagnosis not present

## 2018-03-26 MED ORDER — METRONIDAZOLE 250 MG PO TABS: 250.0000 mg | ORAL_TABLET | Freq: Three times a day (TID) | ORAL | 0 refills | Status: DC

## 2018-03-26 MED ORDER — DEXLANSOPRAZOLE 30 MG PO CPDR: 30.0000 mg | DELAYED_RELEASE_CAPSULE | Freq: Every day | ORAL | 5 refills | Status: DC

## 2018-03-26 NOTE — Progress Notes (Signed)
Reason for consultation.;  Abdominal bloating.  History of present illness:  Patient is 77 year old [place] male who is referred through courtesy of Dr. Garwin Brothers for GI evaluation.  He is accompanied by his daughter dr. Alcide Clever. He presents with over 2-year history of abdominal bloating and frequent burping.  His bloating is more pronounced after meals and he also feels pressure in his head.  This bloating is not associated with nausea or vomiting.  He has several year history of GERD and has been on omeprazole.  Recently he was switched to [place] but cannot tell any difference.  He also complains of poor appetite but has not lost any weight.  He generally eats 2 meals a day.  He also complains of bitter taste in his mouth when he wakes up.  He had EGD by Dr. Wilfrid Lund last year revealing nonerosive gastritis but H. pylori serology was negative. His bloating may last for hours.  He is not bloated this afternoon.  His bowels move daily.  He does not have diarrhea constipation melena or rectal bleeding.  He had normal screening colonoscopy in June 2012. Review of the systems is also positive for pain in his shoulders legs and both feet.  He was previously on Lipitor and switch to Crestor.  He cannot tell any difference. He does yoga for 30 to 45 minutes every morning but he does not do any physical activity.  According to his daughter he used to walk regularly but not doing any more.  He states he does not sleep well. He has been under a lot of stress.  He lost his wife and their son due to a auto accident in [place] about 6 months ago.  He and his granddaughter survived that transiently. He was recently prescribed mirtazapine which she stopped after 1 dose.   Current Medications: Outpatient Encounter Medications as of 03/26/2018  Medication Sig  . Blood Glucose Monitoring Suppl KIT by Does not apply route.  Marland Kitchen dexlansoprazole (DEXILANT) 60 MG capsule Take 60 mg by mouth daily.   Marland Kitchen  gabapentin (NEURONTIN) 300 MG capsule Take 300 mg by mouth 2 (two) times daily.  . Glucose Blood (ACCU-CHEK AVIVA PLUS VI) 1 strip by In Vitro route daily.  Marland Kitchen glucose blood test strip 1 each by Other route as needed for other. Use as instructed  . metFORMIN (GLUCOPHAGE) 500 MG tablet Take 500 mg by mouth 2 (two) times daily with a meal.  . Multiple Vitamin (MULTIVITAMIN WITH MINERALS) TABS tablet Take 1 tablet by mouth daily. Centrum Silver  . Omega-3 Fatty Acids (FISH OIL PO) Take 1 capsule by mouth daily.  . rosuvastatin (CRESTOR) 10 MG tablet Take 10 mg by mouth at bedtime.   . [DISCONTINUED] aspirin EC 81 MG tablet Take 81 mg by mouth daily.  . [DISCONTINUED] omeprazole (PRILOSEC) 40 MG capsule Take 1 tab twice daily. (Patient not taking: Reported on 03/26/2018)  . [DISCONTINUED] Vitamin D, Ergocalciferol, (DRISDOL) 50000 UNITS CAPS capsule Take 50,000 Units by mouth every 7 (seven) days. On Thursdays   Facility-Administered Encounter Medications as of 03/26/2018  Medication  . 0.9 %  sodium chloride infusion   Past medical history:  Diabetes mellitus of 25 years.  Recent A1c was 6.4. Coronary artery disease.  He had CABG in 2008.  He had exercise tolerance test in June 2019 and was normal. Chronic GERD. Hyperlipidemia. He had lumbar spine surgery for spinal stenosis in September 2016. Few days after discharge she was admitted with nausea  vomiting abdominal pain and found to have partial small bowel obstruction versus ileus and responded to conservative therapy. He has had bilateral cataract extraction. He had screening colonoscopy in June 2012 revealing external hemorrhoids otherwise normal examination. He also had EGD in June 2012 revealing nonerosive gastritis. Recent EGD was in August 2018 revealing mild gastritis and biopsy was negative for H. pylori.   Allergies:  Allergies  Allergen Reactions  . Oxycodone     Caused bowel obstruction    Family history:  Both parents  are disease.  Father lived to be 95. He has 5 brothers and 5 sisters living.  2 brothers are diabetic.  One brother is disease.   Social history:  He is widowed.  His wife and son died in auto accident 6 months ago. He is a retired.  He taught Conservation officer, nature at [employer]. He does not smoke cigarettes or drink alcohol.  He he lives with his daughter in [place].  He is heading back to [place] next month.   Physical examination: Blood pressure 130/70, pulse 64, temperature 97.9 F (36.6 C), temperature source Oral, resp. rate 18, height 5' 5"  (1.651 m), weight 161 lb 14.4 oz (73.4 kg). Patient is alert and in no acute distress. He has mild hearing impairment. Conjunctiva is pink. Sclera is nonicteric Oropharyngeal mucosa is normal. No neck masses or thyromegaly noted. Cardiac exam with regular rhythm normal S1 and S2. No murmur or gallop noted. Lungs are clear to auscultation. Abdomen is symmetrical.  Bowel sounds are normal.  No bruit noted.  On palpation abdomen is soft and nontender with organomegaly or masses.  Percussion note is normal. No LE edema or clubbing noted.  Labs/studies Results:  Lab data from 02/12/2018 WBC 11.0, H&H 12 and 36.8.  Platelet count 299K.  TSH 3.350 PSA 0.6. B12 577.  Gastric emptying study on 02/26/2018 was normal.  55% activity in stomach at 60 minutes and 2% and 120 minutes.  Assessment:  #1.  Chronic bloating.  He has not responded to PPI.  EGD 1 year ago was negative for peptic ulcer disease or H. pylori gastritis and similarly gastric emptying studies within normal limits.  He remains to be seen if bloating is due to excessive flatulence or perhaps part and parcel of chronic dyspepsia.  I wonder if PPI is contributing to the symptoms.  Since his heartburn is well controlled with therapy dose could be reduced.  He may well have small intestinal bacterial overgrowth. I do not see that he had ultrasound in our database.  Will  check with Dr. Reesa Chew may consider one unless he had one within the last 2 years.  #2.  Chronic GERD.  Heartburn is well controlled with therapy.  #3.  Mild anemia appears to be chronic.  Recent B12 level was normal.  He is up-to-date re-CRC screening.   Recommendations:  Decrease her dexlansoprazole to 30 mg p.o. every morning. Metronidazole 250 mg p.o. 3 times daily for 10 days. Patient will keep daily symptom diary as to lowering bodies on antibiotic. KUB when patient's bloating is most pronounced to determine if he has gastric small bowel or colonic distention. Patient encouraged to increased physical activity such as walking.  If nothing else it will help his appetite. Upper abdominal ultrasound unless one has been done within the last 2 years. Progress report in 2 weeks.

## 2018-03-26 NOTE — Patient Instructions (Signed)
Increase physical activity i.e. walk regularly as discussed. Keep daily symptom diary for 2 weeks while on antibiotic. Can take Tylenol 500 mg twice daily as needed for musculoskeletal pain.

## 2018-03-27 ENCOUNTER — Encounter (INDEPENDENT_AMBULATORY_CARE_PROVIDER_SITE_OTHER): Payer: Self-pay | Admitting: Internal Medicine

## 2018-03-30 ENCOUNTER — Other Ambulatory Visit (INDEPENDENT_AMBULATORY_CARE_PROVIDER_SITE_OTHER): Payer: Self-pay | Admitting: *Deleted

## 2018-03-30 DIAGNOSIS — R1084 Generalized abdominal pain: Secondary | ICD-10-CM

## 2018-03-30 DIAGNOSIS — R140 Abdominal distension (gaseous): Secondary | ICD-10-CM

## 2018-04-09 DIAGNOSIS — I313 Pericardial effusion (noninflammatory): Secondary | ICD-10-CM | POA: Diagnosis not present

## 2018-04-09 DIAGNOSIS — I080 Rheumatic disorders of both mitral and aortic valves: Secondary | ICD-10-CM | POA: Diagnosis not present

## 2018-04-13 ENCOUNTER — Other Ambulatory Visit (INDEPENDENT_AMBULATORY_CARE_PROVIDER_SITE_OTHER): Payer: Self-pay | Admitting: *Deleted

## 2018-04-13 DIAGNOSIS — Z886 Allergy status to analgesic agent status: Secondary | ICD-10-CM | POA: Diagnosis not present

## 2018-04-13 DIAGNOSIS — R1314 Dysphagia, pharyngoesophageal phase: Secondary | ICD-10-CM | POA: Diagnosis not present

## 2018-04-13 MED ORDER — LANSOPRAZOLE 30 MG PO CPDR: 30.0000 mg | DELAYED_RELEASE_CAPSULE | Freq: Two times a day (BID) | ORAL | 30 refills | Status: DC

## 2018-04-13 MED ORDER — PANTOPRAZOLE SODIUM 40 MG PO TBEC: 40.0000 mg | DELAYED_RELEASE_TABLET | Freq: Every day | ORAL | 6 refills | Status: DC

## 2018-04-13 NOTE — Telephone Encounter (Signed)
Patient's insurance would not cover Dexilant and the patient had already tried the Omeprazole.  Per Dr.Rehman the patient may try the Lansoprazole 30 mg - take 1 twice a day before a meal. #60  5 refills. This was escribed to the patient's pharmacy.

## 2018-04-15 ENCOUNTER — Other Ambulatory Visit: Payer: Medicare Other

## 2018-04-23 ENCOUNTER — Ambulatory Visit
Admission: RE | Admit: 2018-04-23 | Discharge: 2018-04-23 | Disposition: A | Discharge status: Home / Self Care | Payer: Medicare Other | Source: Ambulatory Visit | Attending: Internal Medicine | Admitting: Internal Medicine

## 2018-04-23 DIAGNOSIS — R1084 Generalized abdominal pain: Secondary | ICD-10-CM

## 2018-04-23 DIAGNOSIS — R140 Abdominal distension (gaseous): Secondary | ICD-10-CM

## 2018-04-23 DIAGNOSIS — R109 Unspecified abdominal pain: Secondary | ICD-10-CM | POA: Diagnosis not present

## 2018-05-12 DIAGNOSIS — Z23 Encounter for immunization: Secondary | ICD-10-CM | POA: Diagnosis not present

## 2018-07-06 DIAGNOSIS — E782 Mixed hyperlipidemia: Secondary | ICD-10-CM | POA: Diagnosis not present

## 2018-07-06 DIAGNOSIS — Z13228 Encounter for screening for other metabolic disorders: Secondary | ICD-10-CM | POA: Diagnosis not present

## 2018-07-06 DIAGNOSIS — E1169 Type 2 diabetes mellitus with other specified complication: Secondary | ICD-10-CM | POA: Diagnosis not present

## 2018-07-06 DIAGNOSIS — N401 Enlarged prostate with lower urinary tract symptoms: Secondary | ICD-10-CM | POA: Diagnosis not present

## 2018-07-06 DIAGNOSIS — G629 Polyneuropathy, unspecified: Secondary | ICD-10-CM | POA: Diagnosis not present

## 2018-07-06 DIAGNOSIS — D649 Anemia, unspecified: Secondary | ICD-10-CM | POA: Diagnosis not present

## 2018-07-06 DIAGNOSIS — Z13220 Encounter for screening for lipoid disorders: Secondary | ICD-10-CM | POA: Diagnosis not present

## 2018-07-06 DIAGNOSIS — E1142 Type 2 diabetes mellitus with diabetic polyneuropathy: Secondary | ICD-10-CM | POA: Diagnosis not present

## 2018-07-07 IMAGING — US US ABDOMEN COMPLETE
1 series · 13 of 25 positions shown · non-contrast
Comparison: CT [DATE].

CLINICAL DATA: Abdominal pain.

EXAM:
ABDOMEN ULTRASOUND COMPLETE

[Series 1: us abdomen complete · 0.16mm/px · 13 of 92 slices shown]
[im 1/92]
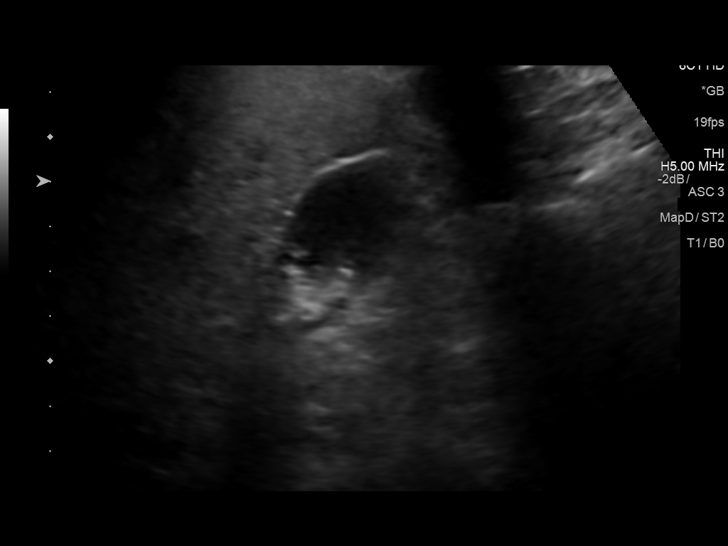
[im 8/92]
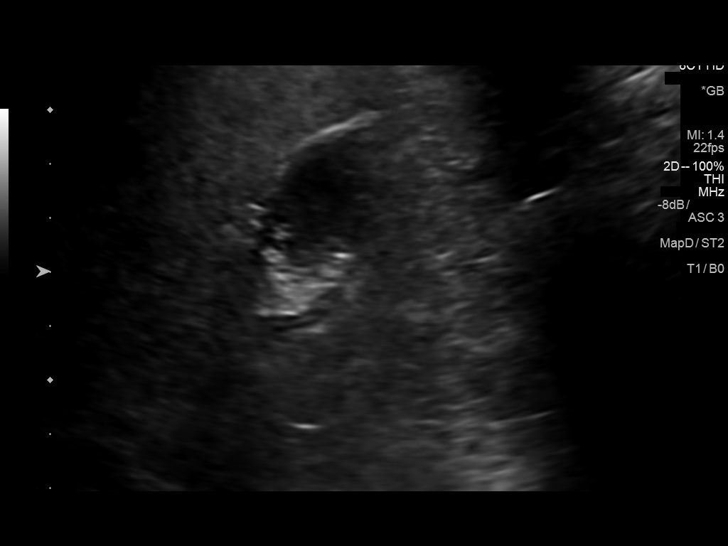
[im 16/92]
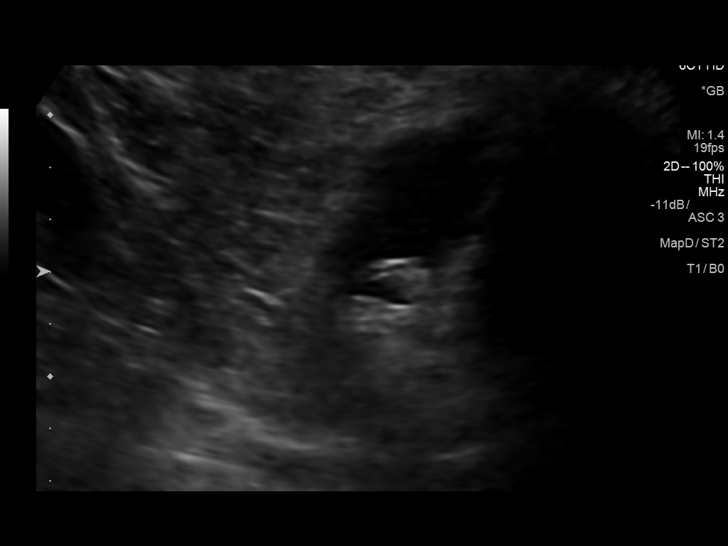
[im 23/92]
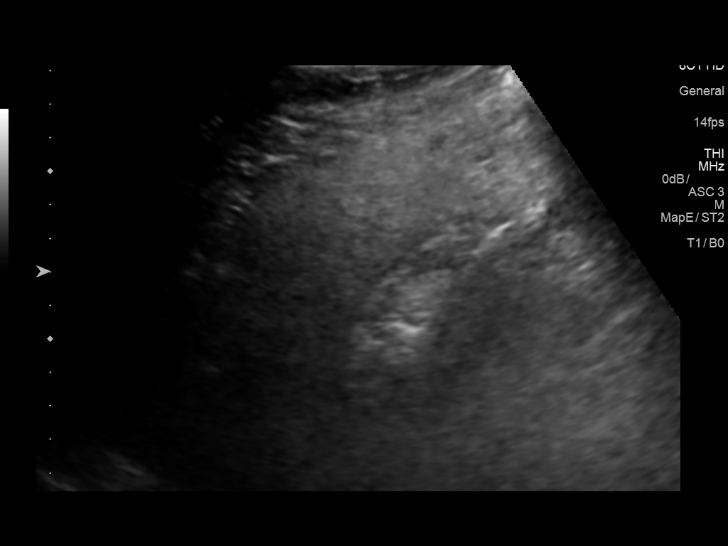
[im 31/92]
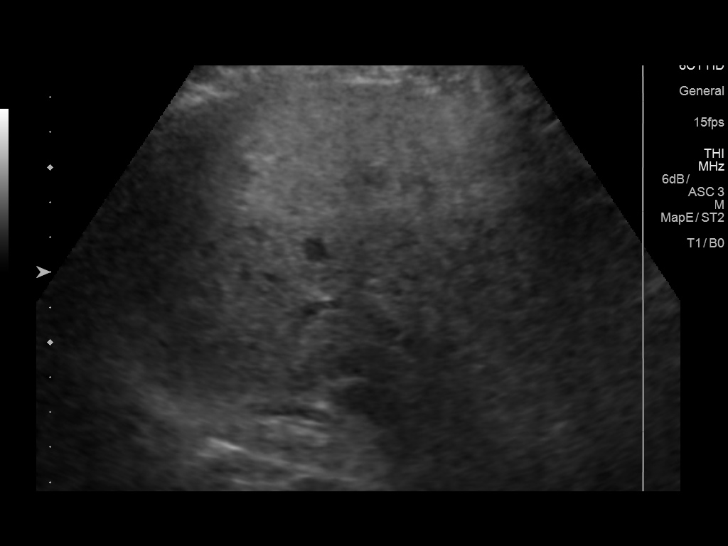
[im 38/92]
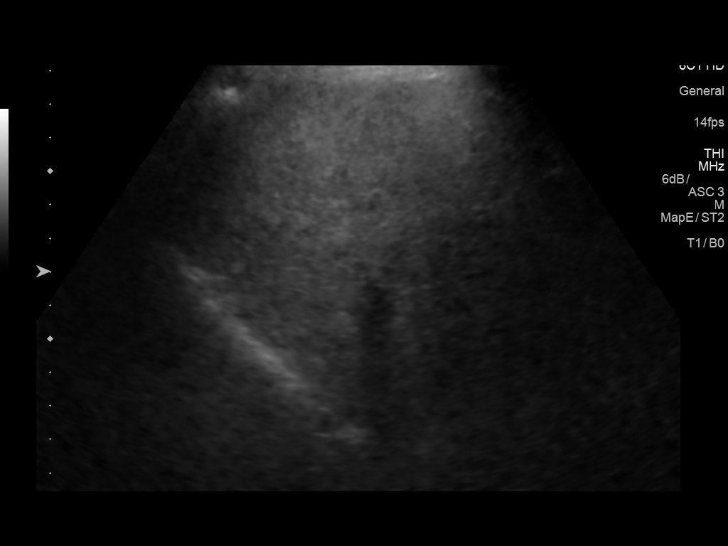
[im 46/92]
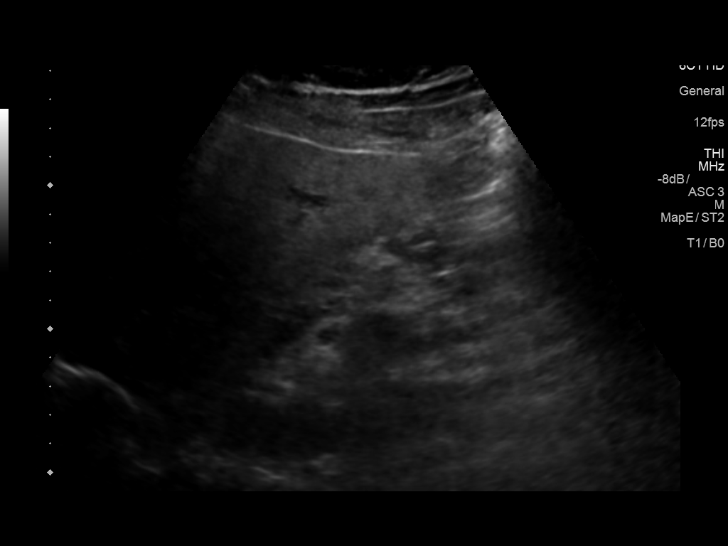
[im 54/92]
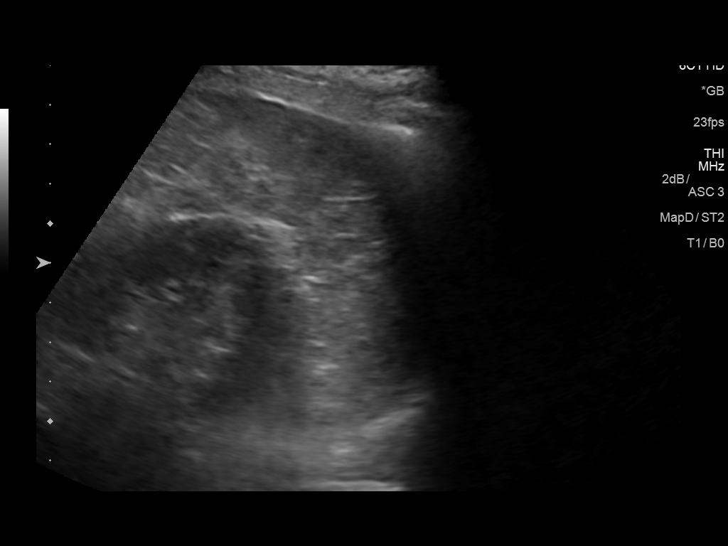
[im 61/92]
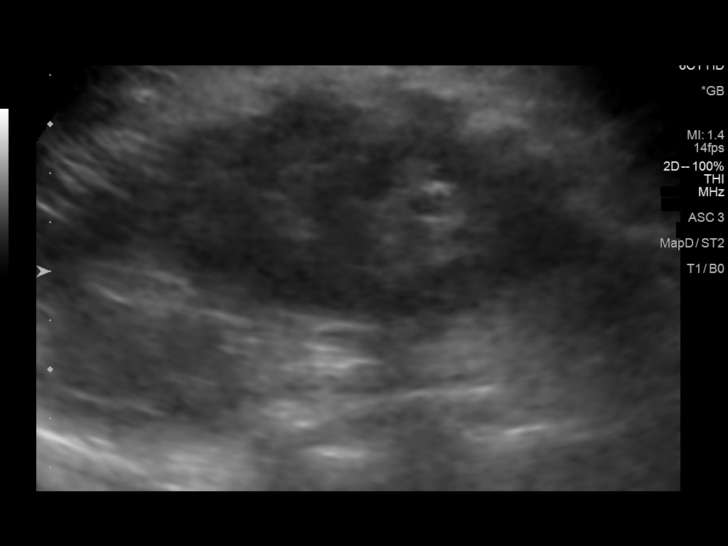
[im 69/92]
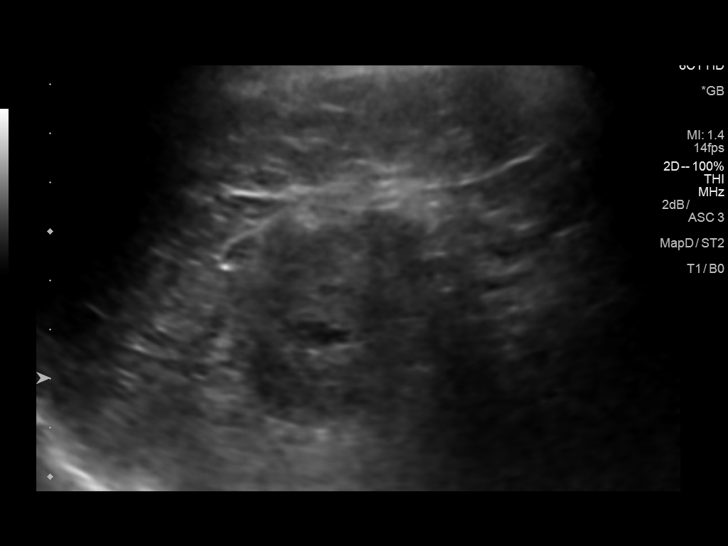
[im 76/92]
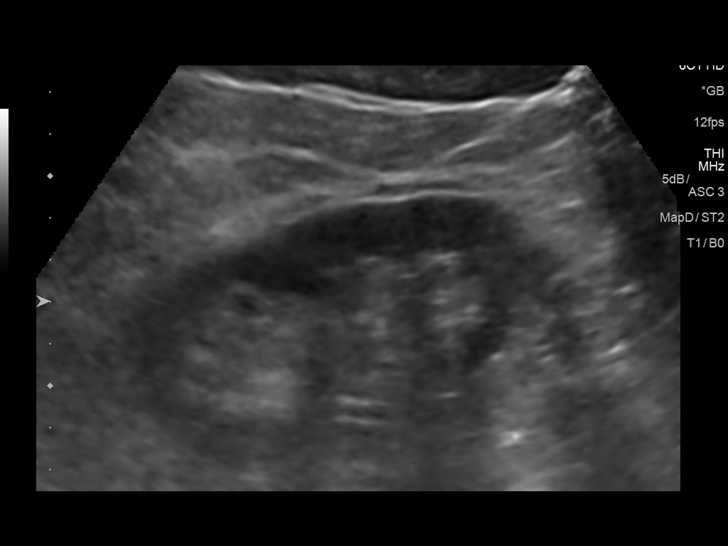
[im 84/92]
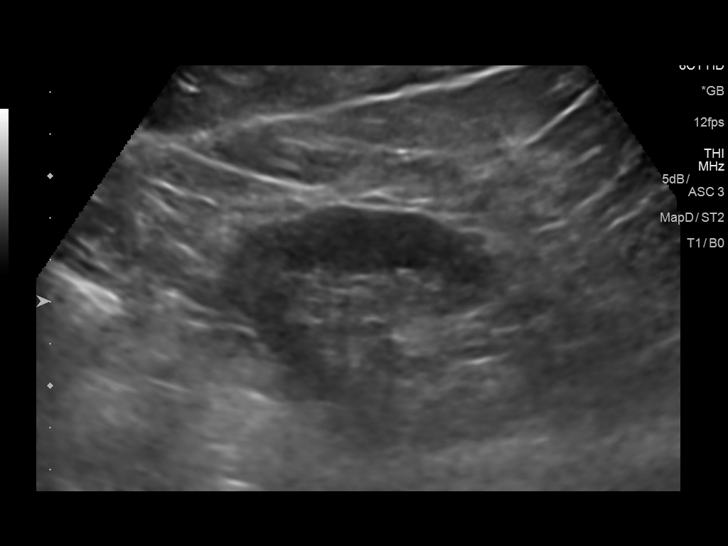
[im 92/92]
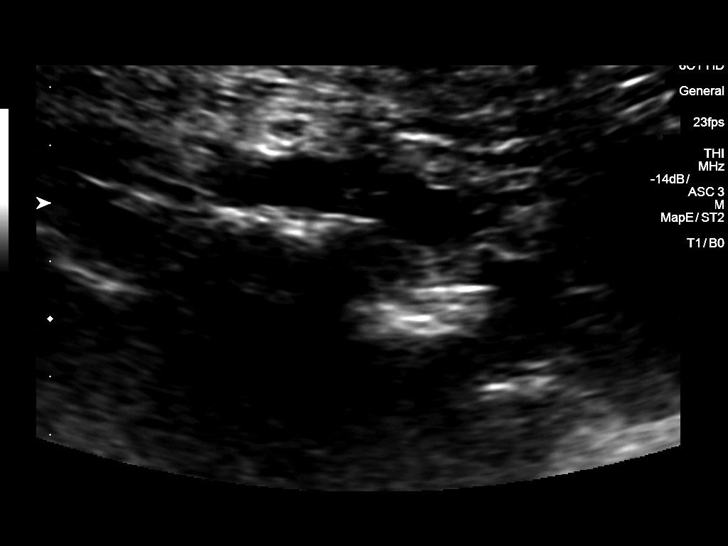

[13 of 25 positions shown; findings below may reference images not displayed]

FINDINGS: Gallbladder: No gallstones or wall thickening visualized. No
sonographic Murphy sign noted by sonographer.

Common bile duct: Diameter: 4.2 mm

Liver: Increased echogenicity consistent fatty infiltration and/or
hepatocellular disease. No focal hepatic abnormality identified.
Portal vein is patent on color Doppler imaging with normal direction
of blood flow towards the liver.

IVC: No abnormality visualized.

Pancreas: Visualized portion unremarkable.

Spleen: Size and appearance within normal limits.

Right Kidney: Length: 10.4 cm. Echogenicity within normal limits. No
hydronephrosis visualized. Benign-appearing 0.9 cm and 0.8 cm tiny
cysts. Thin septations may be present within the cysts. The cysts
appear to be relatively stable from prior CT of [DATE].

Left Kidney: Length: 10.5 cm. Echogenicity within normal limits. No
mass or hydronephrosis visualized.

Abdominal aorta: No aneurysm visualized.

Other findings: None.
IMPRESSION: 1.  No gallstones or biliary distention.

2. Increased hepatic echogenicity consistent fatty infiltration or
hepatocellular disease. 3. Benign-appearing stable cysts right
kidney.

## 2018-07-08 DIAGNOSIS — Z6827 Body mass index (BMI) 27.0-27.9, adult: Secondary | ICD-10-CM | POA: Diagnosis not present

## 2018-07-08 DIAGNOSIS — E1169 Type 2 diabetes mellitus with other specified complication: Secondary | ICD-10-CM | POA: Diagnosis not present

## 2018-07-08 DIAGNOSIS — E782 Mixed hyperlipidemia: Secondary | ICD-10-CM | POA: Diagnosis not present

## 2018-07-08 DIAGNOSIS — I25810 Atherosclerosis of coronary artery bypass graft(s) without angina pectoris: Secondary | ICD-10-CM | POA: Diagnosis not present

## 2018-07-08 DIAGNOSIS — Z1389 Encounter for screening for other disorder: Secondary | ICD-10-CM | POA: Diagnosis not present

## 2018-07-08 DIAGNOSIS — N138 Other obstructive and reflux uropathy: Secondary | ICD-10-CM | POA: Diagnosis not present

## 2018-07-08 DIAGNOSIS — Z23 Encounter for immunization: Secondary | ICD-10-CM | POA: Diagnosis not present

## 2018-07-08 DIAGNOSIS — N401 Enlarged prostate with lower urinary tract symptoms: Secondary | ICD-10-CM | POA: Diagnosis not present

## 2019-12-10 DIAGNOSIS — I200 Unstable angina: Secondary | ICD-10-CM | POA: Diagnosis not present

## 2019-12-10 DIAGNOSIS — I249 Acute ischemic heart disease, unspecified: Secondary | ICD-10-CM | POA: Diagnosis not present

## 2019-12-10 DIAGNOSIS — I2510 Atherosclerotic heart disease of native coronary artery without angina pectoris: Secondary | ICD-10-CM | POA: Diagnosis not present

## 2019-12-10 DIAGNOSIS — J810 Acute pulmonary edema: Secondary | ICD-10-CM | POA: Diagnosis not present

## 2019-12-10 DIAGNOSIS — I25700 Atherosclerosis of coronary artery bypass graft(s), unspecified, with unstable angina pectoris: Secondary | ICD-10-CM | POA: Diagnosis not present

## 2019-12-10 DIAGNOSIS — Z951 Presence of aortocoronary bypass graft: Secondary | ICD-10-CM | POA: Diagnosis not present

## 2019-12-10 DIAGNOSIS — I959 Hypotension, unspecified: Secondary | ICD-10-CM | POA: Diagnosis not present

## 2019-12-10 DIAGNOSIS — E119 Type 2 diabetes mellitus without complications: Secondary | ICD-10-CM | POA: Diagnosis not present

## 2019-12-10 DIAGNOSIS — Z20822 Contact with and (suspected) exposure to covid-19: Secondary | ICD-10-CM | POA: Diagnosis not present

## 2019-12-10 DIAGNOSIS — R9389 Abnormal findings on diagnostic imaging of other specified body structures: Secondary | ICD-10-CM | POA: Diagnosis not present

## 2019-12-10 DIAGNOSIS — E785 Hyperlipidemia, unspecified: Secondary | ICD-10-CM | POA: Diagnosis not present

## 2019-12-10 DIAGNOSIS — R7303 Prediabetes: Secondary | ICD-10-CM | POA: Diagnosis not present

## 2019-12-10 DIAGNOSIS — I25110 Atherosclerotic heart disease of native coronary artery with unstable angina pectoris: Secondary | ICD-10-CM | POA: Diagnosis not present

## 2019-12-10 DIAGNOSIS — K219 Gastro-esophageal reflux disease without esophagitis: Secondary | ICD-10-CM | POA: Diagnosis not present

## 2019-12-10 DIAGNOSIS — R0602 Shortness of breath: Secondary | ICD-10-CM | POA: Diagnosis not present

## 2019-12-10 DIAGNOSIS — Z7984 Long term (current) use of oral hypoglycemic drugs: Secondary | ICD-10-CM | POA: Diagnosis not present

## 2019-12-10 DIAGNOSIS — Z79899 Other long term (current) drug therapy: Secondary | ICD-10-CM | POA: Diagnosis not present

## 2019-12-10 DIAGNOSIS — I10 Essential (primary) hypertension: Secondary | ICD-10-CM | POA: Diagnosis not present

## 2019-12-10 DIAGNOSIS — Z7982 Long term (current) use of aspirin: Secondary | ICD-10-CM | POA: Diagnosis not present

## 2019-12-10 DIAGNOSIS — E871 Hypo-osmolality and hyponatremia: Secondary | ICD-10-CM | POA: Diagnosis not present

## 2019-12-10 DIAGNOSIS — R570 Cardiogenic shock: Secondary | ICD-10-CM | POA: Diagnosis not present

## 2019-12-10 DIAGNOSIS — I5021 Acute systolic (congestive) heart failure: Secondary | ICD-10-CM | POA: Diagnosis not present

## 2019-12-10 DIAGNOSIS — R079 Chest pain, unspecified: Secondary | ICD-10-CM | POA: Diagnosis not present

## 2019-12-10 DIAGNOSIS — I214 Non-ST elevation (NSTEMI) myocardial infarction: Secondary | ICD-10-CM | POA: Diagnosis not present

## 2019-12-11 DIAGNOSIS — R9389 Abnormal findings on diagnostic imaging of other specified body structures: Secondary | ICD-10-CM | POA: Diagnosis not present

## 2019-12-11 DIAGNOSIS — R570 Cardiogenic shock: Secondary | ICD-10-CM | POA: Diagnosis not present

## 2019-12-11 DIAGNOSIS — R9431 Abnormal electrocardiogram [ECG] [EKG]: Secondary | ICD-10-CM | POA: Diagnosis not present

## 2019-12-11 DIAGNOSIS — R918 Other nonspecific abnormal finding of lung field: Secondary | ICD-10-CM | POA: Diagnosis not present

## 2019-12-11 DIAGNOSIS — I2510 Atherosclerotic heart disease of native coronary artery without angina pectoris: Secondary | ICD-10-CM | POA: Diagnosis not present

## 2019-12-11 DIAGNOSIS — I200 Unstable angina: Secondary | ICD-10-CM | POA: Diagnosis not present

## 2019-12-11 DIAGNOSIS — I5021 Acute systolic (congestive) heart failure: Secondary | ICD-10-CM | POA: Diagnosis not present

## 2019-12-12 DIAGNOSIS — I350 Nonrheumatic aortic (valve) stenosis: Secondary | ICD-10-CM | POA: Diagnosis not present

## 2019-12-12 DIAGNOSIS — Z955 Presence of coronary angioplasty implant and graft: Secondary | ICD-10-CM | POA: Diagnosis not present

## 2019-12-12 DIAGNOSIS — I2510 Atherosclerotic heart disease of native coronary artery without angina pectoris: Secondary | ICD-10-CM | POA: Diagnosis not present

## 2019-12-12 DIAGNOSIS — Z951 Presence of aortocoronary bypass graft: Secondary | ICD-10-CM | POA: Diagnosis not present

## 2019-12-12 DIAGNOSIS — E785 Hyperlipidemia, unspecified: Secondary | ICD-10-CM | POA: Diagnosis not present

## 2019-12-12 DIAGNOSIS — R918 Other nonspecific abnormal finding of lung field: Secondary | ICD-10-CM | POA: Diagnosis not present

## 2019-12-12 DIAGNOSIS — I050 Rheumatic mitral stenosis: Secondary | ICD-10-CM | POA: Diagnosis not present

## 2020-01-19 DIAGNOSIS — I2510 Atherosclerotic heart disease of native coronary artery without angina pectoris: Secondary | ICD-10-CM | POA: Diagnosis not present

## 2020-01-19 DIAGNOSIS — I10 Essential (primary) hypertension: Secondary | ICD-10-CM | POA: Diagnosis not present

## 2020-01-19 DIAGNOSIS — E785 Hyperlipidemia, unspecified: Secondary | ICD-10-CM | POA: Diagnosis not present

## 2020-02-08 DIAGNOSIS — I38 Endocarditis, valve unspecified: Secondary | ICD-10-CM | POA: Diagnosis not present

## 2020-02-08 DIAGNOSIS — I25798 Atherosclerosis of other coronary artery bypass graft(s) with other forms of angina pectoris: Secondary | ICD-10-CM | POA: Diagnosis not present

## 2020-02-08 DIAGNOSIS — R0602 Shortness of breath: Secondary | ICD-10-CM | POA: Diagnosis not present

## 2020-02-08 DIAGNOSIS — I252 Old myocardial infarction: Secondary | ICD-10-CM | POA: Diagnosis not present

## 2020-02-08 DIAGNOSIS — I10 Essential (primary) hypertension: Secondary | ICD-10-CM | POA: Diagnosis not present

## 2020-02-08 DIAGNOSIS — E785 Hyperlipidemia, unspecified: Secondary | ICD-10-CM | POA: Diagnosis not present

## 2020-02-08 DIAGNOSIS — R5383 Other fatigue: Secondary | ICD-10-CM | POA: Diagnosis not present

## 2020-02-15 DIAGNOSIS — N401 Enlarged prostate with lower urinary tract symptoms: Secondary | ICD-10-CM | POA: Diagnosis not present

## 2020-02-15 DIAGNOSIS — R351 Nocturia: Secondary | ICD-10-CM | POA: Diagnosis not present

## 2020-02-15 DIAGNOSIS — E559 Vitamin D deficiency, unspecified: Secondary | ICD-10-CM | POA: Diagnosis not present

## 2020-02-15 DIAGNOSIS — E119 Type 2 diabetes mellitus without complications: Secondary | ICD-10-CM | POA: Diagnosis not present

## 2020-02-15 DIAGNOSIS — I10 Essential (primary) hypertension: Secondary | ICD-10-CM | POA: Diagnosis not present

## 2020-02-15 DIAGNOSIS — E782 Mixed hyperlipidemia: Secondary | ICD-10-CM | POA: Diagnosis not present

## 2020-02-15 DIAGNOSIS — R350 Frequency of micturition: Secondary | ICD-10-CM | POA: Diagnosis not present

## 2020-02-15 DIAGNOSIS — E1169 Type 2 diabetes mellitus with other specified complication: Secondary | ICD-10-CM | POA: Diagnosis not present

## 2020-02-15 DIAGNOSIS — I25708 Atherosclerosis of coronary artery bypass graft(s), unspecified, with other forms of angina pectoris: Secondary | ICD-10-CM | POA: Diagnosis not present

## 2020-02-29 DIAGNOSIS — D509 Iron deficiency anemia, unspecified: Secondary | ICD-10-CM | POA: Diagnosis not present

## 2020-03-08 DIAGNOSIS — Z1211 Encounter for screening for malignant neoplasm of colon: Secondary | ICD-10-CM | POA: Diagnosis not present

## 2020-03-16 DIAGNOSIS — Z974 Presence of external hearing-aid: Secondary | ICD-10-CM | POA: Diagnosis not present

## 2020-03-16 DIAGNOSIS — H903 Sensorineural hearing loss, bilateral: Secondary | ICD-10-CM | POA: Diagnosis not present

## 2020-03-16 DIAGNOSIS — H6123 Impacted cerumen, bilateral: Secondary | ICD-10-CM | POA: Diagnosis not present

## 2020-04-12 DIAGNOSIS — D509 Iron deficiency anemia, unspecified: Secondary | ICD-10-CM | POA: Diagnosis not present

## 2020-04-25 DIAGNOSIS — E113293 Type 2 diabetes mellitus with mild nonproliferative diabetic retinopathy without macular edema, bilateral: Secondary | ICD-10-CM | POA: Diagnosis not present

## 2020-04-25 DIAGNOSIS — H33193 Other retinoschisis and retinal cysts, bilateral: Secondary | ICD-10-CM | POA: Diagnosis not present

## 2020-04-26 DIAGNOSIS — H43823 Vitreomacular adhesion, bilateral: Secondary | ICD-10-CM | POA: Diagnosis not present

## 2020-04-26 DIAGNOSIS — E113293 Type 2 diabetes mellitus with mild nonproliferative diabetic retinopathy without macular edema, bilateral: Secondary | ICD-10-CM | POA: Diagnosis not present

## 2020-04-27 DIAGNOSIS — H906 Mixed conductive and sensorineural hearing loss, bilateral: Secondary | ICD-10-CM | POA: Diagnosis not present

## 2020-05-30 DIAGNOSIS — Z23 Encounter for immunization: Secondary | ICD-10-CM | POA: Diagnosis not present

## 2020-06-21 DIAGNOSIS — D509 Iron deficiency anemia, unspecified: Secondary | ICD-10-CM | POA: Diagnosis not present

## 2020-06-28 DIAGNOSIS — D509 Iron deficiency anemia, unspecified: Secondary | ICD-10-CM | POA: Diagnosis not present

## 2020-07-11 DIAGNOSIS — E1169 Type 2 diabetes mellitus with other specified complication: Secondary | ICD-10-CM | POA: Diagnosis not present

## 2020-07-11 DIAGNOSIS — R899 Unspecified abnormal finding in specimens from other organs, systems and tissues: Secondary | ICD-10-CM | POA: Diagnosis not present

## 2020-07-19 DIAGNOSIS — E1169 Type 2 diabetes mellitus with other specified complication: Secondary | ICD-10-CM | POA: Diagnosis not present

## 2020-07-19 DIAGNOSIS — Z23 Encounter for immunization: Secondary | ICD-10-CM | POA: Diagnosis not present

## 2020-07-19 DIAGNOSIS — R350 Frequency of micturition: Secondary | ICD-10-CM | POA: Diagnosis not present

## 2020-07-19 DIAGNOSIS — I10 Essential (primary) hypertension: Secondary | ICD-10-CM | POA: Diagnosis not present

## 2020-07-19 DIAGNOSIS — Z6822 Body mass index (BMI) 22.0-22.9, adult: Secondary | ICD-10-CM | POA: Diagnosis not present

## 2020-07-19 DIAGNOSIS — Z0000 Encounter for general adult medical examination without abnormal findings: Secondary | ICD-10-CM | POA: Diagnosis not present

## 2020-07-19 DIAGNOSIS — Z1389 Encounter for screening for other disorder: Secondary | ICD-10-CM | POA: Diagnosis not present

## 2020-07-19 DIAGNOSIS — E782 Mixed hyperlipidemia: Secondary | ICD-10-CM | POA: Diagnosis not present

## 2020-07-20 DIAGNOSIS — I252 Old myocardial infarction: Secondary | ICD-10-CM | POA: Diagnosis not present

## 2020-07-20 DIAGNOSIS — R0602 Shortness of breath: Secondary | ICD-10-CM | POA: Diagnosis not present

## 2020-07-20 DIAGNOSIS — I25798 Atherosclerosis of other coronary artery bypass graft(s) with other forms of angina pectoris: Secondary | ICD-10-CM | POA: Diagnosis not present

## 2020-07-20 DIAGNOSIS — I10 Essential (primary) hypertension: Secondary | ICD-10-CM | POA: Diagnosis not present

## 2020-07-20 DIAGNOSIS — I255 Ischemic cardiomyopathy: Secondary | ICD-10-CM | POA: Diagnosis not present

## 2020-07-20 DIAGNOSIS — I38 Endocarditis, valve unspecified: Secondary | ICD-10-CM | POA: Diagnosis not present

## 2020-07-20 DIAGNOSIS — I2510 Atherosclerotic heart disease of native coronary artery without angina pectoris: Secondary | ICD-10-CM | POA: Diagnosis not present

## 2020-07-20 DIAGNOSIS — E785 Hyperlipidemia, unspecified: Secondary | ICD-10-CM | POA: Diagnosis not present

## 2020-09-19 DIAGNOSIS — R319 Hematuria, unspecified: Secondary | ICD-10-CM | POA: Diagnosis not present

## 2020-09-19 DIAGNOSIS — D649 Anemia, unspecified: Secondary | ICD-10-CM | POA: Diagnosis not present

## 2020-09-19 DIAGNOSIS — E119 Type 2 diabetes mellitus without complications: Secondary | ICD-10-CM | POA: Diagnosis not present

## 2020-09-26 DIAGNOSIS — E1169 Type 2 diabetes mellitus with other specified complication: Secondary | ICD-10-CM | POA: Diagnosis not present

## 2020-09-26 DIAGNOSIS — I10 Essential (primary) hypertension: Secondary | ICD-10-CM | POA: Diagnosis not present

## 2020-10-03 DIAGNOSIS — R899 Unspecified abnormal finding in specimens from other organs, systems and tissues: Secondary | ICD-10-CM | POA: Diagnosis not present

## 2020-10-11 DIAGNOSIS — R3121 Asymptomatic microscopic hematuria: Secondary | ICD-10-CM | POA: Diagnosis not present

## 2020-10-19 DIAGNOSIS — R3121 Asymptomatic microscopic hematuria: Secondary | ICD-10-CM | POA: Diagnosis not present

## 2020-10-19 DIAGNOSIS — R350 Frequency of micturition: Secondary | ICD-10-CM | POA: Diagnosis not present

## 2020-10-19 DIAGNOSIS — R351 Nocturia: Secondary | ICD-10-CM | POA: Diagnosis not present

## 2020-10-24 DIAGNOSIS — R3121 Asymptomatic microscopic hematuria: Secondary | ICD-10-CM | POA: Diagnosis not present

## 2020-10-26 DIAGNOSIS — K8020 Calculus of gallbladder without cholecystitis without obstruction: Secondary | ICD-10-CM | POA: Diagnosis not present

## 2020-10-26 DIAGNOSIS — R3129 Other microscopic hematuria: Secondary | ICD-10-CM | POA: Diagnosis not present

## 2020-10-26 DIAGNOSIS — N3289 Other specified disorders of bladder: Secondary | ICD-10-CM | POA: Diagnosis not present

## 2020-10-26 DIAGNOSIS — R3121 Asymptomatic microscopic hematuria: Secondary | ICD-10-CM | POA: Diagnosis not present

## 2020-10-26 DIAGNOSIS — N281 Cyst of kidney, acquired: Secondary | ICD-10-CM | POA: Diagnosis not present

## 2020-11-22 DIAGNOSIS — R5383 Other fatigue: Secondary | ICD-10-CM | POA: Diagnosis not present

## 2020-11-22 DIAGNOSIS — R29898 Other symptoms and signs involving the musculoskeletal system: Secondary | ICD-10-CM | POA: Diagnosis not present

## 2020-11-22 DIAGNOSIS — R0600 Dyspnea, unspecified: Secondary | ICD-10-CM | POA: Diagnosis not present

## 2020-11-22 DIAGNOSIS — D649 Anemia, unspecified: Secondary | ICD-10-CM | POA: Diagnosis not present

## 2020-11-22 DIAGNOSIS — R0602 Shortness of breath: Secondary | ICD-10-CM | POA: Diagnosis not present

## 2020-11-23 DIAGNOSIS — E559 Vitamin D deficiency, unspecified: Secondary | ICD-10-CM | POA: Diagnosis not present

## 2020-11-23 DIAGNOSIS — I38 Endocarditis, valve unspecified: Secondary | ICD-10-CM | POA: Diagnosis not present

## 2020-11-23 DIAGNOSIS — R5383 Other fatigue: Secondary | ICD-10-CM | POA: Diagnosis not present

## 2020-11-23 DIAGNOSIS — I252 Old myocardial infarction: Secondary | ICD-10-CM | POA: Diagnosis not present

## 2020-11-23 DIAGNOSIS — I10 Essential (primary) hypertension: Secondary | ICD-10-CM | POA: Diagnosis not present

## 2020-11-23 DIAGNOSIS — I517 Cardiomegaly: Secondary | ICD-10-CM | POA: Diagnosis not present

## 2020-11-23 DIAGNOSIS — I083 Combined rheumatic disorders of mitral, aortic and tricuspid valves: Secondary | ICD-10-CM | POA: Diagnosis not present

## 2020-11-23 DIAGNOSIS — R0602 Shortness of breath: Secondary | ICD-10-CM | POA: Diagnosis not present

## 2020-11-23 DIAGNOSIS — I25118 Atherosclerotic heart disease of native coronary artery with other forms of angina pectoris: Secondary | ICD-10-CM | POA: Diagnosis not present

## 2020-11-24 DIAGNOSIS — Z20822 Contact with and (suspected) exposure to covid-19: Secondary | ICD-10-CM | POA: Diagnosis not present

## 2020-11-24 DIAGNOSIS — R0602 Shortness of breath: Secondary | ICD-10-CM | POA: Diagnosis not present

## 2020-11-24 DIAGNOSIS — R0600 Dyspnea, unspecified: Secondary | ICD-10-CM | POA: Diagnosis not present

## 2020-11-27 DIAGNOSIS — R350 Frequency of micturition: Secondary | ICD-10-CM | POA: Diagnosis not present

## 2020-11-27 DIAGNOSIS — E559 Vitamin D deficiency, unspecified: Secondary | ICD-10-CM | POA: Diagnosis not present

## 2020-11-30 DIAGNOSIS — I10 Essential (primary) hypertension: Secondary | ICD-10-CM | POA: Diagnosis not present

## 2020-11-30 DIAGNOSIS — E871 Hypo-osmolality and hyponatremia: Secondary | ICD-10-CM | POA: Diagnosis not present

## 2020-11-30 DIAGNOSIS — R5383 Other fatigue: Secondary | ICD-10-CM | POA: Diagnosis not present

## 2020-12-06 DIAGNOSIS — R350 Frequency of micturition: Secondary | ICD-10-CM | POA: Diagnosis not present

## 2020-12-06 DIAGNOSIS — R3121 Asymptomatic microscopic hematuria: Secondary | ICD-10-CM | POA: Diagnosis not present

## 2020-12-06 DIAGNOSIS — R351 Nocturia: Secondary | ICD-10-CM | POA: Diagnosis not present

## 2020-12-12 DIAGNOSIS — R0600 Dyspnea, unspecified: Secondary | ICD-10-CM | POA: Diagnosis not present

## 2020-12-12 DIAGNOSIS — E782 Mixed hyperlipidemia: Secondary | ICD-10-CM | POA: Diagnosis not present

## 2020-12-12 DIAGNOSIS — E871 Hypo-osmolality and hyponatremia: Secondary | ICD-10-CM | POA: Diagnosis not present

## 2020-12-14 DIAGNOSIS — E782 Mixed hyperlipidemia: Secondary | ICD-10-CM | POA: Diagnosis not present

## 2020-12-14 DIAGNOSIS — R0600 Dyspnea, unspecified: Secondary | ICD-10-CM | POA: Diagnosis not present

## 2020-12-14 DIAGNOSIS — E871 Hypo-osmolality and hyponatremia: Secondary | ICD-10-CM | POA: Diagnosis not present

## 2020-12-27 DIAGNOSIS — R918 Other nonspecific abnormal finding of lung field: Secondary | ICD-10-CM | POA: Diagnosis not present

## 2021-01-17 DIAGNOSIS — J84112 Idiopathic pulmonary fibrosis: Secondary | ICD-10-CM | POA: Diagnosis not present

## 2021-01-17 DIAGNOSIS — K8020 Calculus of gallbladder without cholecystitis without obstruction: Secondary | ICD-10-CM | POA: Diagnosis not present

## 2021-01-17 DIAGNOSIS — I2510 Atherosclerotic heart disease of native coronary artery without angina pectoris: Secondary | ICD-10-CM | POA: Diagnosis not present

## 2021-01-17 DIAGNOSIS — I700 Atherosclerosis of aorta: Secondary | ICD-10-CM | POA: Diagnosis not present

## 2021-01-17 DIAGNOSIS — J929 Pleural plaque without asbestos: Secondary | ICD-10-CM | POA: Diagnosis not present

## 2021-01-17 DIAGNOSIS — J479 Bronchiectasis, uncomplicated: Secondary | ICD-10-CM | POA: Diagnosis not present

## 2021-01-17 DIAGNOSIS — R918 Other nonspecific abnormal finding of lung field: Secondary | ICD-10-CM | POA: Diagnosis not present

## 2021-01-17 DIAGNOSIS — I517 Cardiomegaly: Secondary | ICD-10-CM | POA: Diagnosis not present

## 2021-01-24 DIAGNOSIS — J84112 Idiopathic pulmonary fibrosis: Secondary | ICD-10-CM | POA: Diagnosis not present

## 2021-01-31 DIAGNOSIS — Z23 Encounter for immunization: Secondary | ICD-10-CM | POA: Diagnosis not present

## 2021-02-26 DIAGNOSIS — J84112 Idiopathic pulmonary fibrosis: Secondary | ICD-10-CM | POA: Diagnosis not present

## 2021-03-07 DIAGNOSIS — L209 Atopic dermatitis, unspecified: Secondary | ICD-10-CM | POA: Diagnosis not present

## 2021-03-07 DIAGNOSIS — E782 Mixed hyperlipidemia: Secondary | ICD-10-CM | POA: Diagnosis not present

## 2021-03-07 DIAGNOSIS — I25810 Atherosclerosis of coronary artery bypass graft(s) without angina pectoris: Secondary | ICD-10-CM | POA: Diagnosis not present

## 2021-03-07 DIAGNOSIS — D509 Iron deficiency anemia, unspecified: Secondary | ICD-10-CM | POA: Diagnosis not present

## 2021-03-07 DIAGNOSIS — E1169 Type 2 diabetes mellitus with other specified complication: Secondary | ICD-10-CM | POA: Diagnosis not present

## 2021-03-13 DIAGNOSIS — J84112 Idiopathic pulmonary fibrosis: Secondary | ICD-10-CM | POA: Diagnosis not present

## 2021-03-27 DIAGNOSIS — L2084 Intrinsic (allergic) eczema: Secondary | ICD-10-CM | POA: Diagnosis not present

## 2021-04-11 DIAGNOSIS — E1169 Type 2 diabetes mellitus with other specified complication: Secondary | ICD-10-CM | POA: Diagnosis not present

## 2021-04-11 DIAGNOSIS — E782 Mixed hyperlipidemia: Secondary | ICD-10-CM | POA: Diagnosis not present

## 2021-04-11 DIAGNOSIS — E559 Vitamin D deficiency, unspecified: Secondary | ICD-10-CM | POA: Diagnosis not present

## 2021-04-17 DIAGNOSIS — H6123 Impacted cerumen, bilateral: Secondary | ICD-10-CM | POA: Diagnosis not present

## 2021-04-19 DIAGNOSIS — E785 Hyperlipidemia, unspecified: Secondary | ICD-10-CM | POA: Diagnosis not present

## 2021-04-19 DIAGNOSIS — I517 Cardiomegaly: Secondary | ICD-10-CM | POA: Diagnosis not present

## 2021-04-19 DIAGNOSIS — I252 Old myocardial infarction: Secondary | ICD-10-CM | POA: Diagnosis not present

## 2021-04-19 DIAGNOSIS — Z7984 Long term (current) use of oral hypoglycemic drugs: Secondary | ICD-10-CM | POA: Diagnosis not present

## 2021-04-19 DIAGNOSIS — E871 Hypo-osmolality and hyponatremia: Secondary | ICD-10-CM | POA: Diagnosis not present

## 2021-04-19 DIAGNOSIS — R0609 Other forms of dyspnea: Secondary | ICD-10-CM | POA: Diagnosis not present

## 2021-04-19 DIAGNOSIS — K219 Gastro-esophageal reflux disease without esophagitis: Secondary | ICD-10-CM | POA: Diagnosis not present

## 2021-04-19 DIAGNOSIS — Z951 Presence of aortocoronary bypass graft: Secondary | ICD-10-CM | POA: Diagnosis not present

## 2021-04-19 DIAGNOSIS — I2510 Atherosclerotic heart disease of native coronary artery without angina pectoris: Secondary | ICD-10-CM | POA: Diagnosis not present

## 2021-04-19 DIAGNOSIS — J849 Interstitial pulmonary disease, unspecified: Secondary | ICD-10-CM | POA: Diagnosis not present

## 2021-04-19 DIAGNOSIS — R590 Localized enlarged lymph nodes: Secondary | ICD-10-CM | POA: Diagnosis not present

## 2021-04-19 DIAGNOSIS — R0602 Shortness of breath: Secondary | ICD-10-CM | POA: Diagnosis not present

## 2021-04-19 DIAGNOSIS — R531 Weakness: Secondary | ICD-10-CM | POA: Diagnosis not present

## 2021-04-19 DIAGNOSIS — F32A Depression, unspecified: Secondary | ICD-10-CM | POA: Diagnosis not present

## 2021-04-19 DIAGNOSIS — R918 Other nonspecific abnormal finding of lung field: Secondary | ICD-10-CM | POA: Diagnosis not present

## 2021-04-19 DIAGNOSIS — E119 Type 2 diabetes mellitus without complications: Secondary | ICD-10-CM | POA: Diagnosis not present

## 2021-05-02 DIAGNOSIS — Z23 Encounter for immunization: Secondary | ICD-10-CM | POA: Diagnosis not present

## 2021-05-02 DIAGNOSIS — J84112 Idiopathic pulmonary fibrosis: Secondary | ICD-10-CM | POA: Diagnosis not present

## 2021-06-06 DIAGNOSIS — J84112 Idiopathic pulmonary fibrosis: Secondary | ICD-10-CM | POA: Diagnosis not present

## 2021-06-06 DIAGNOSIS — Z23 Encounter for immunization: Secondary | ICD-10-CM | POA: Diagnosis not present

## 2021-06-06 DIAGNOSIS — Z79899 Other long term (current) drug therapy: Secondary | ICD-10-CM | POA: Diagnosis not present

## 2021-06-08 DIAGNOSIS — H43823 Vitreomacular adhesion, bilateral: Secondary | ICD-10-CM | POA: Diagnosis not present

## 2021-06-08 DIAGNOSIS — E113293 Type 2 diabetes mellitus with mild nonproliferative diabetic retinopathy without macular edema, bilateral: Secondary | ICD-10-CM | POA: Diagnosis not present

## 2021-07-03 DIAGNOSIS — R0602 Shortness of breath: Secondary | ICD-10-CM | POA: Diagnosis not present

## 2021-07-03 DIAGNOSIS — I38 Endocarditis, valve unspecified: Secondary | ICD-10-CM | POA: Diagnosis not present

## 2021-07-03 DIAGNOSIS — I10 Essential (primary) hypertension: Secondary | ICD-10-CM | POA: Diagnosis not present

## 2021-07-03 DIAGNOSIS — I25798 Atherosclerosis of other coronary artery bypass graft(s) with other forms of angina pectoris: Secondary | ICD-10-CM | POA: Diagnosis not present

## 2021-07-03 DIAGNOSIS — I252 Old myocardial infarction: Secondary | ICD-10-CM | POA: Diagnosis not present

## 2021-07-03 DIAGNOSIS — E785 Hyperlipidemia, unspecified: Secondary | ICD-10-CM | POA: Diagnosis not present

## 2021-10-15 DIAGNOSIS — I25118 Atherosclerotic heart disease of native coronary artery with other forms of angina pectoris: Secondary | ICD-10-CM | POA: Diagnosis not present

## 2021-10-15 DIAGNOSIS — Z125 Encounter for screening for malignant neoplasm of prostate: Secondary | ICD-10-CM | POA: Diagnosis not present

## 2021-10-15 DIAGNOSIS — D649 Anemia, unspecified: Secondary | ICD-10-CM | POA: Diagnosis not present

## 2021-10-15 DIAGNOSIS — E1169 Type 2 diabetes mellitus with other specified complication: Secondary | ICD-10-CM | POA: Diagnosis not present

## 2021-10-15 DIAGNOSIS — E782 Mixed hyperlipidemia: Secondary | ICD-10-CM | POA: Diagnosis not present

## 2021-10-15 DIAGNOSIS — I10 Essential (primary) hypertension: Secondary | ICD-10-CM | POA: Diagnosis not present

## 2021-11-28 ENCOUNTER — Emergency Department (HOSPITAL_COMMUNITY): Payer: Medicare Other

## 2021-11-28 ENCOUNTER — Other Ambulatory Visit: Payer: Self-pay

## 2021-11-28 ENCOUNTER — Observation Stay (HOSPITAL_COMMUNITY)
Admission: EM | Admit: 2021-11-28 | Discharge: 2021-11-30 | Disposition: A | Discharge status: Home / Self Care | Payer: Medicare Other | Attending: Internal Medicine | Admitting: Internal Medicine

## 2021-11-28 ENCOUNTER — Encounter (HOSPITAL_COMMUNITY): Payer: Self-pay

## 2021-11-28 ENCOUNTER — Observation Stay (HOSPITAL_COMMUNITY): Payer: Medicare Other

## 2021-11-28 DIAGNOSIS — M79601 Pain in right arm: Secondary | ICD-10-CM | POA: Diagnosis present

## 2021-11-28 DIAGNOSIS — I6621 Occlusion and stenosis of right posterior cerebral artery: Secondary | ICD-10-CM | POA: Diagnosis not present

## 2021-11-28 DIAGNOSIS — M79603 Pain in arm, unspecified: Secondary | ICD-10-CM | POA: Diagnosis not present

## 2021-11-28 DIAGNOSIS — Z0389 Encounter for observation for other suspected diseases and conditions ruled out: Secondary | ICD-10-CM | POA: Diagnosis not present

## 2021-11-28 DIAGNOSIS — Z7984 Long term (current) use of oral hypoglycemic drugs: Secondary | ICD-10-CM | POA: Diagnosis not present

## 2021-11-28 DIAGNOSIS — E1142 Type 2 diabetes mellitus with diabetic polyneuropathy: Secondary | ICD-10-CM | POA: Diagnosis not present

## 2021-11-28 DIAGNOSIS — M79643 Pain in unspecified hand: Secondary | ICD-10-CM | POA: Diagnosis not present

## 2021-11-28 DIAGNOSIS — I252 Old myocardial infarction: Secondary | ICD-10-CM | POA: Diagnosis not present

## 2021-11-28 DIAGNOSIS — I959 Hypotension, unspecified: Secondary | ICD-10-CM | POA: Diagnosis not present

## 2021-11-28 DIAGNOSIS — M7989 Other specified soft tissue disorders: Secondary | ICD-10-CM | POA: Diagnosis not present

## 2021-11-28 DIAGNOSIS — M19041 Primary osteoarthritis, right hand: Secondary | ICD-10-CM | POA: Diagnosis not present

## 2021-11-28 DIAGNOSIS — R609 Edema, unspecified: Secondary | ICD-10-CM | POA: Diagnosis not present

## 2021-11-28 DIAGNOSIS — I6523 Occlusion and stenosis of bilateral carotid arteries: Secondary | ICD-10-CM | POA: Diagnosis not present

## 2021-11-28 DIAGNOSIS — M542 Cervicalgia: Secondary | ICD-10-CM | POA: Diagnosis not present

## 2021-11-28 DIAGNOSIS — E119 Type 2 diabetes mellitus without complications: Secondary | ICD-10-CM

## 2021-11-28 DIAGNOSIS — R55 Syncope and collapse: Principal | ICD-10-CM | POA: Insufficient documentation

## 2021-11-28 DIAGNOSIS — Z794 Long term (current) use of insulin: Secondary | ICD-10-CM | POA: Insufficient documentation

## 2021-11-28 DIAGNOSIS — R739 Hyperglycemia, unspecified: Secondary | ICD-10-CM | POA: Diagnosis not present

## 2021-11-28 DIAGNOSIS — Z7982 Long term (current) use of aspirin: Secondary | ICD-10-CM | POA: Diagnosis not present

## 2021-11-28 DIAGNOSIS — E1140 Type 2 diabetes mellitus with diabetic neuropathy, unspecified: Secondary | ICD-10-CM | POA: Diagnosis not present

## 2021-11-28 DIAGNOSIS — I2510 Atherosclerotic heart disease of native coronary artery without angina pectoris: Secondary | ICD-10-CM | POA: Diagnosis not present

## 2021-11-28 DIAGNOSIS — E785 Hyperlipidemia, unspecified: Secondary | ICD-10-CM

## 2021-11-28 DIAGNOSIS — R519 Headache, unspecified: Secondary | ICD-10-CM | POA: Diagnosis not present

## 2021-11-28 DIAGNOSIS — Z951 Presence of aortocoronary bypass graft: Secondary | ICD-10-CM | POA: Diagnosis not present

## 2021-11-28 LAB — CBC WITH DIFFERENTIAL/PLATELET
Abs Immature Granulocytes: 0.07 10*3/uL (ref 0.00–0.07)
Basophils Absolute: 0 10*3/uL (ref 0.0–0.1)
Basophils Relative: 0 %
Eosinophils Absolute: 0 10*3/uL (ref 0.0–0.5)
Eosinophils Relative: 0 %
HCT: 38.3 % — ABNORMAL LOW (ref 39.0–52.0)
Hemoglobin: 12.5 g/dL — ABNORMAL LOW (ref 13.0–17.0)
Immature Granulocytes: 1 %
Lymphocytes Relative: 5 %
Lymphs Abs: 0.6 10*3/uL — ABNORMAL LOW (ref 0.7–4.0)
MCH: 28.5 pg (ref 26.0–34.0)
MCHC: 32.6 g/dL (ref 30.0–36.0)
MCV: 87.2 fL (ref 80.0–100.0)
Monocytes Absolute: 0.7 10*3/uL (ref 0.1–1.0)
Monocytes Relative: 6 %
Neutro Abs: 11.3 10*3/uL — ABNORMAL HIGH (ref 1.7–7.7)
Neutrophils Relative %: 88 %
Platelets: 275 10*3/uL (ref 150–400)
RBC: 4.39 MIL/uL (ref 4.22–5.81)
RDW: 13.5 % (ref 11.5–15.5)
WBC: 12.8 10*3/uL — ABNORMAL HIGH (ref 4.0–10.5)
nRBC: 0 % (ref 0.0–0.2)

## 2021-11-28 LAB — COMPREHENSIVE METABOLIC PANEL
ALT: 17 U/L (ref 0–44)
AST: 24 U/L (ref 15–41)
Albumin: 3.7 g/dL (ref 3.5–5.0)
Alkaline Phosphatase: 73 U/L (ref 38–126)
Anion gap: 10 (ref 5–15)
BUN: 24 mg/dL — ABNORMAL HIGH (ref 8–23)
CO2: 18 mmol/L — ABNORMAL LOW (ref 22–32)
Calcium: 9 mg/dL (ref 8.9–10.3)
Chloride: 100 mmol/L (ref 98–111)
Creatinine, Ser: 1.01 mg/dL (ref 0.61–1.24)
GFR, Estimated: >60 mL/min (ref >60)
Glucose, Bld: 207 mg/dL — ABNORMAL HIGH (ref 70–99)
Potassium: 4.3 mmol/L (ref 3.5–5.1)
Sodium: 128 mmol/L — ABNORMAL LOW (ref 135–145)
Total Bilirubin: 0.9 mg/dL (ref 0.3–1.2)
Total Protein: 8 g/dL (ref 6.5–8.1)

## 2021-11-28 LAB — LACTIC ACID, PLASMA
Lactic Acid, Venous: 2 mmol/L (ref 0.5–1.9)
Lactic Acid, Venous: 2.7 mmol/L (ref 0.5–1.9)

## 2021-11-28 LAB — TROPONIN I (HIGH SENSITIVITY): Troponin I (High Sensitivity): 4 ng/L (ref <18)

## 2021-11-28 LAB — TSH: TSH: 0.981 u[IU]/mL (ref 0.350–4.500)

## 2021-11-28 LAB — URIC ACID: Uric Acid, Serum: 3.8 mg/dL (ref 3.7–8.6)

## 2021-11-28 IMAGING — CR DG HAND COMPLETE 3+V*R*
4 series · 4 of 4 positions shown · non-contrast
Comparison: None.

CLINICAL DATA: Pain and swelling the right hand since 2 days and
has gotten progressively worse with some redness

EXAM:
RIGHT HAND - COMPLETE three views, 4 images

[hand pa]
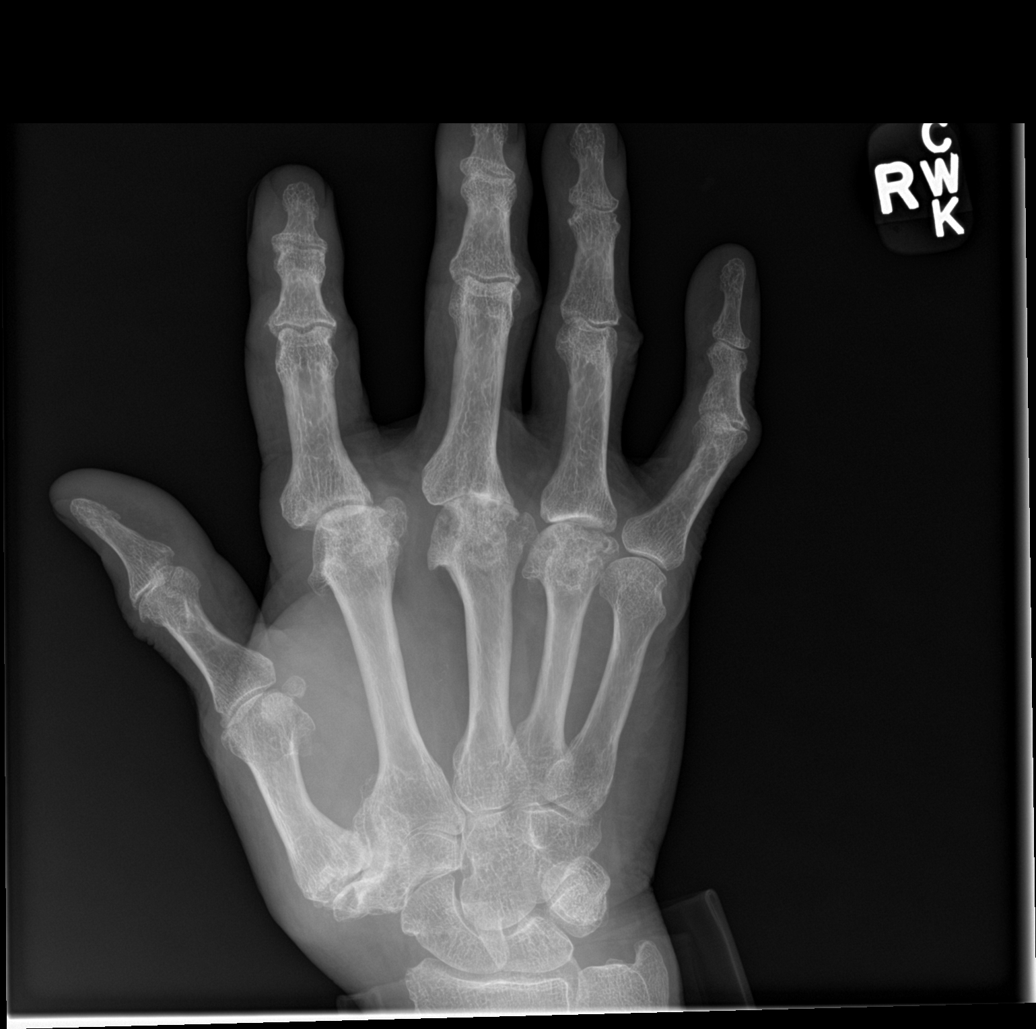

[hand obl]
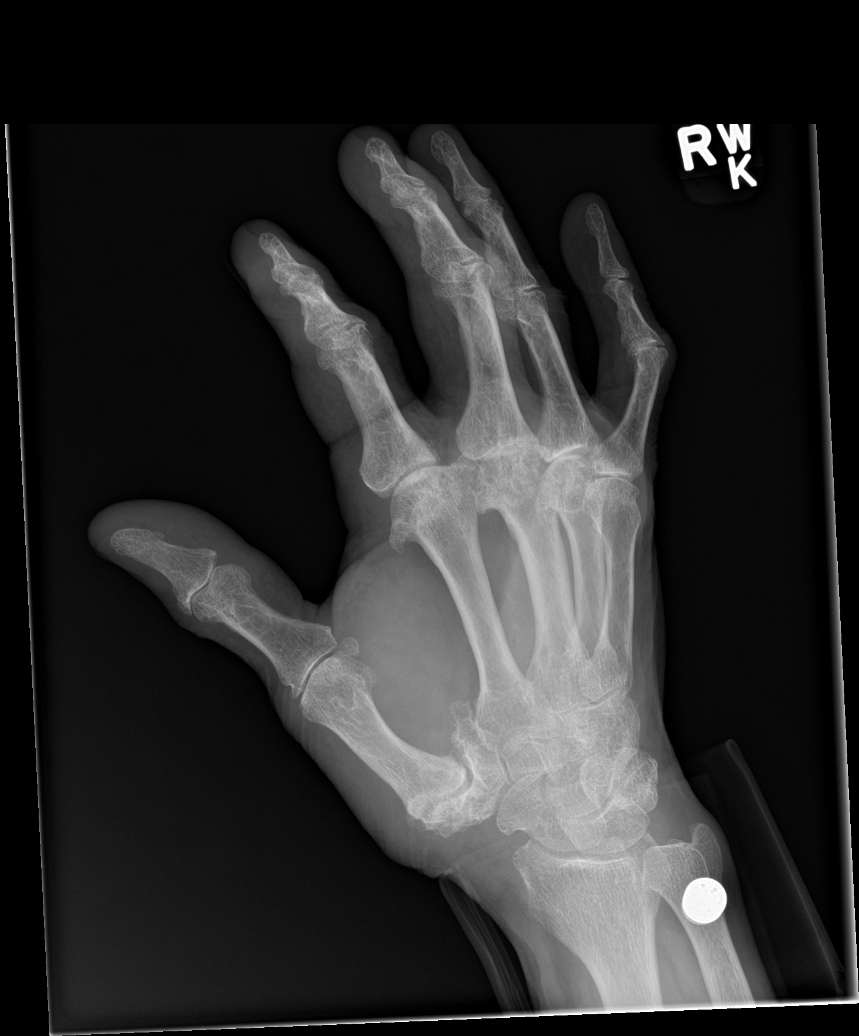

[hand lat (1 of 2)]
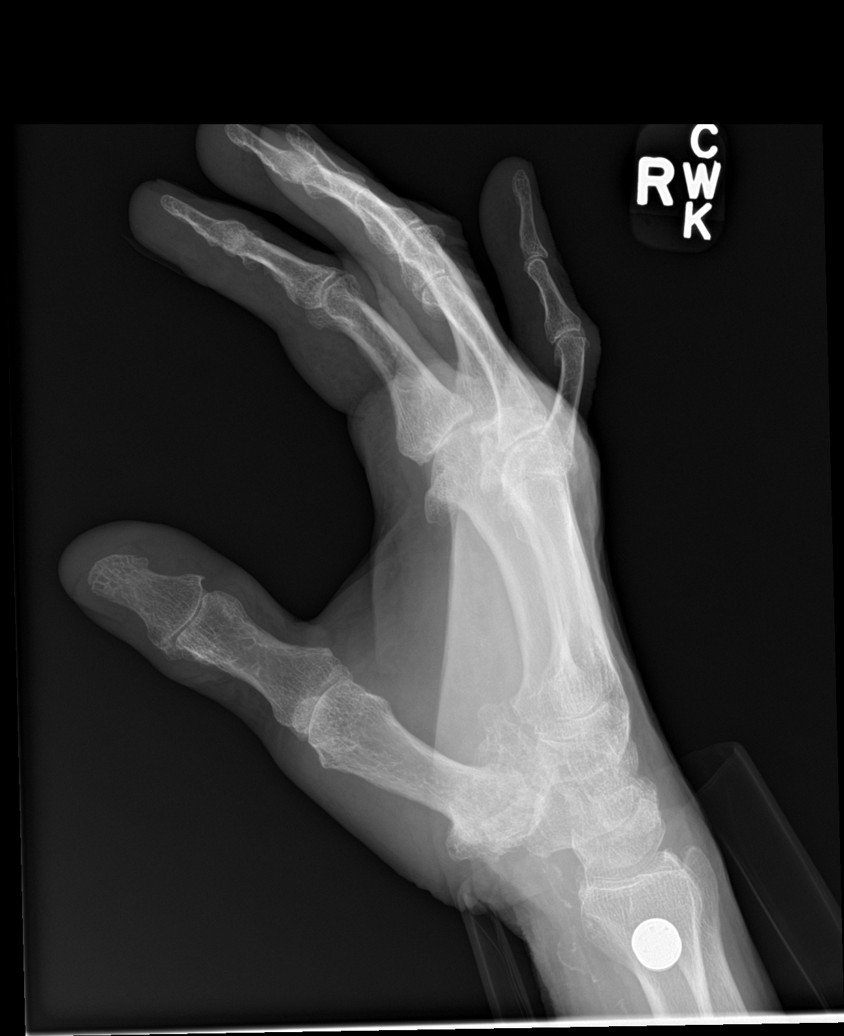

[hand lat (2 of 2)]
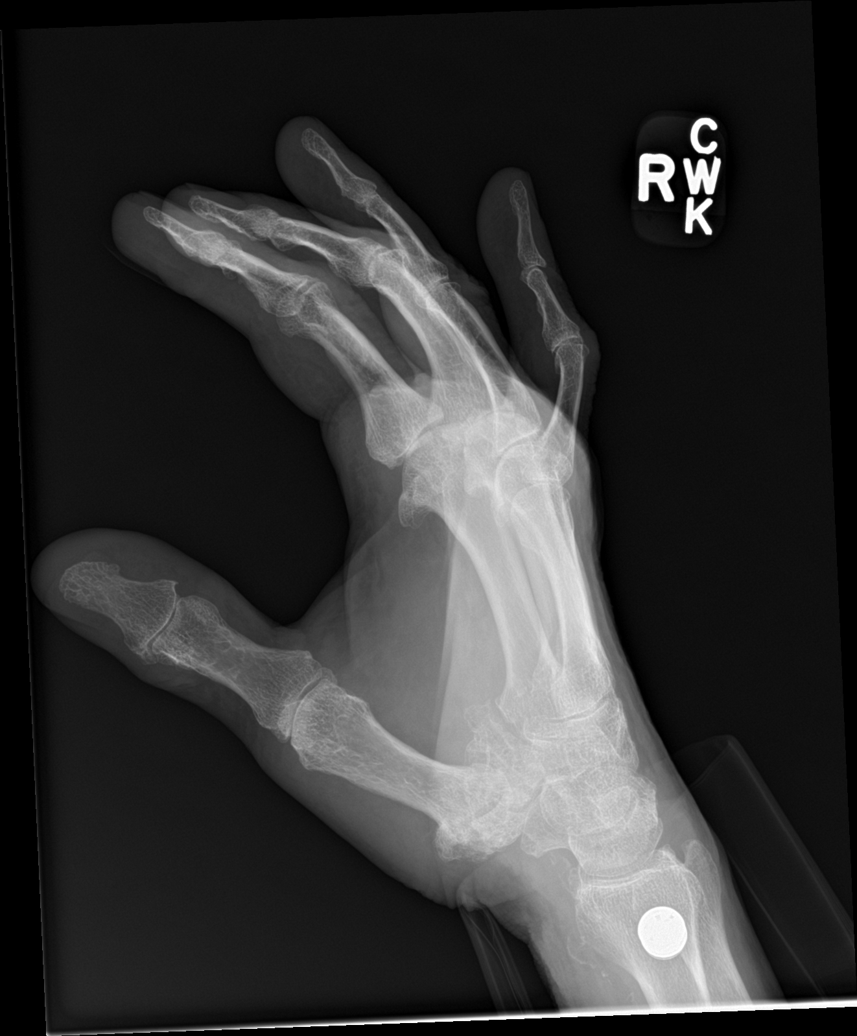

[4 of 4 positions shown; findings below may reference images not displayed]

FINDINGS: There is no evidence of fracture or dislocation. Moderate
degenerative changes in the form of loss of the joint space,
subchondral sclerosis-cystic changes and are more prominent at the
first carpometacarpal joint and to a lesser extent second through
fourth MCP joints. Prominent osteophytes at the heads of the
metacarpals. There is moderately severe soft tissue swelling seen
throughout the hand. No foreign body seen obvious soft tissue
emphysema.
IMPRESSION: No fracture or dislocation. Moderate degenerative changes and
moderately severe soft tissue swelling at the hand.

## 2021-11-28 IMAGING — DX DG CHEST 1V PORT
1 series · 1 of 1 positions shown · non-contrast
Comparison: CT [DATE]

CLINICAL DATA: Syncope

EXAM:
PORTABLE CHEST 1 VIEW

[chest ap]
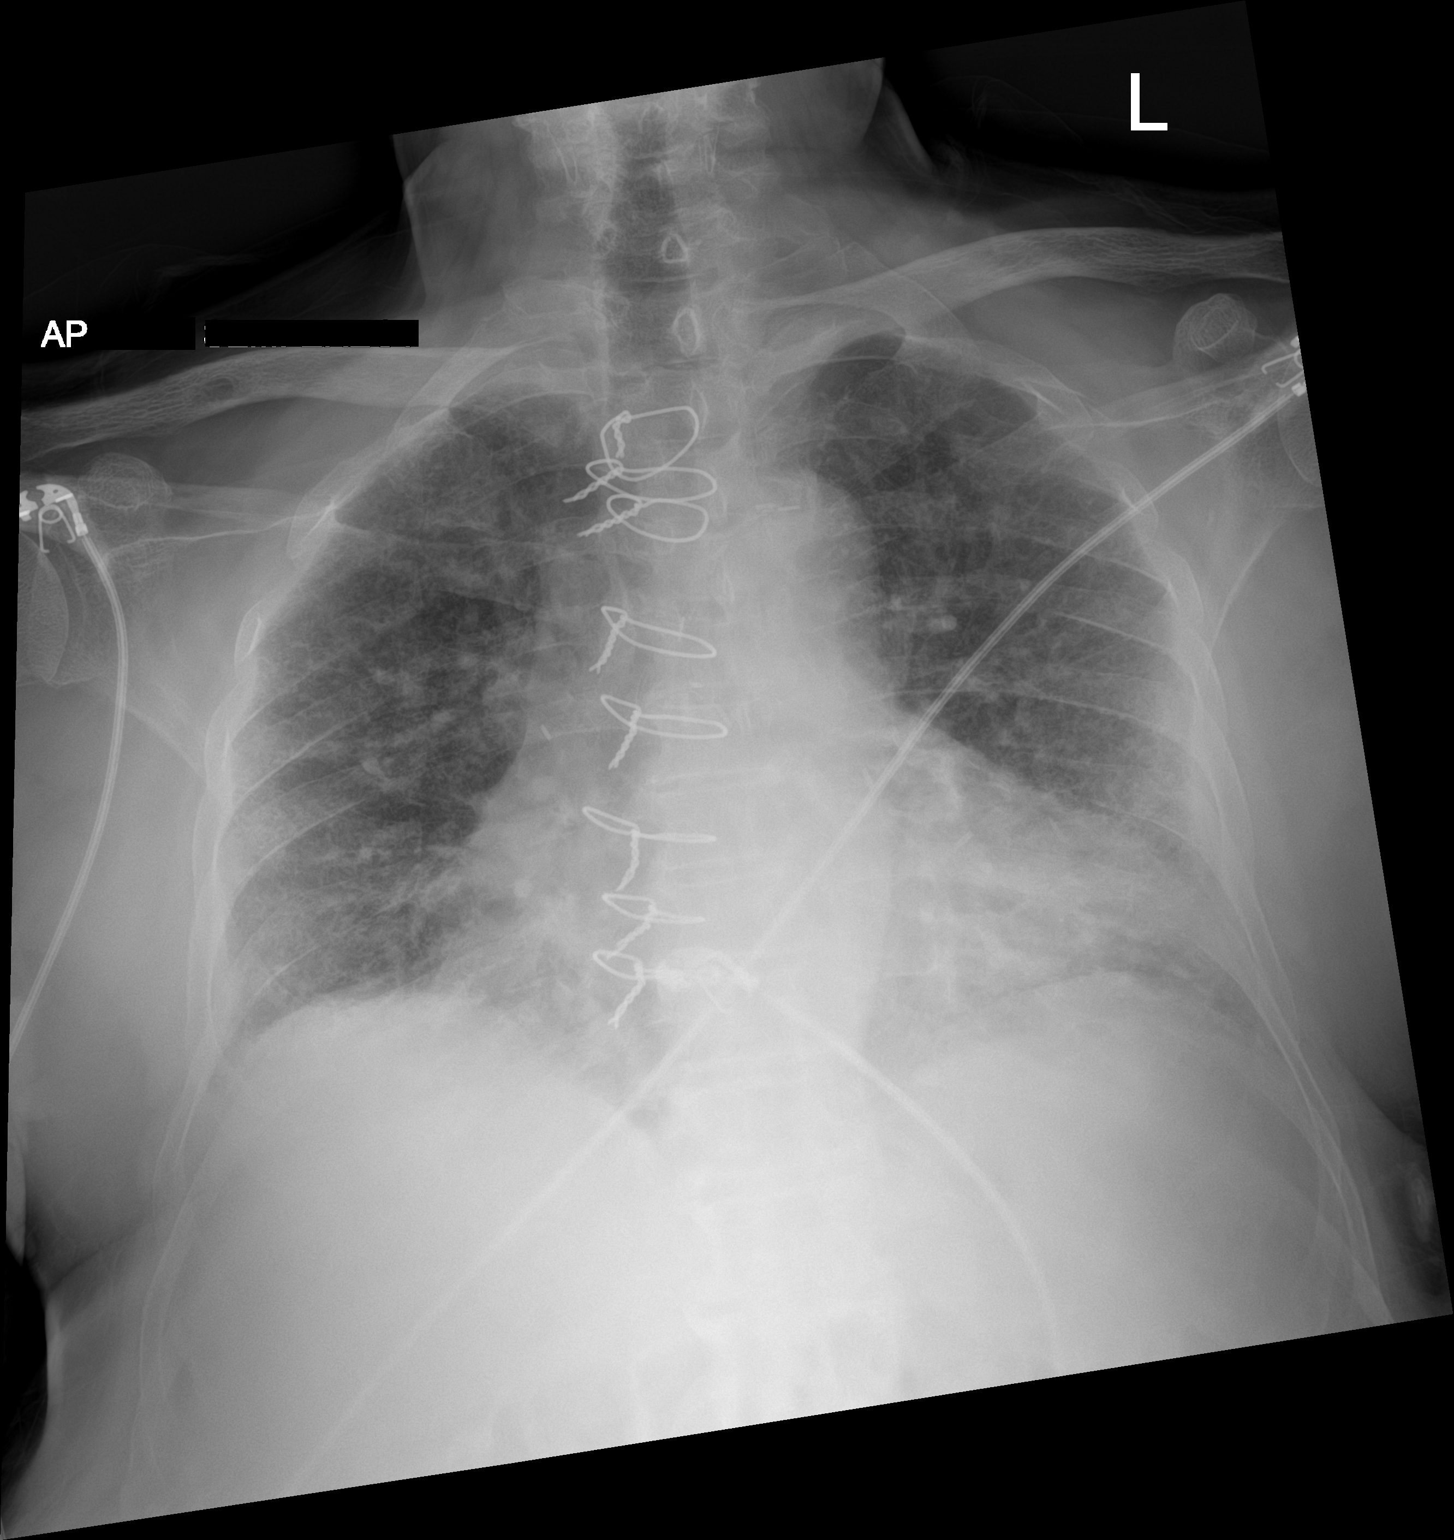

[1 of 1 positions shown; findings below may reference images not displayed]

FINDINGS: Lung volumes are small. Chronic interstitial changes are again
noted, best appreciated within the peripheral lung bases
bilaterally. No superimposed confluent pulmonary infiltrate. No
pneumothorax or pleural effusion. Median sternotomy has been
performed. Cardiac size is enlarged. Pulmonary vascularity is
normal. No acute bone abnormality.
IMPRESSION: Pulmonary hypoinflation.

Stable cardiomegaly

## 2021-11-28 IMAGING — CT CT HEAD W/O CM
4 series · 16 of 47 positions shown, 18 images · non-contrast
Comparison: None.

CLINICAL DATA: Head and neck pain, no history of trauma



[Series 3: head wo · axial · 0.44mm/px · z∈[-94,+20]mm · 7 of 31 slices shown, 9 images]
[im 4/31  brain]
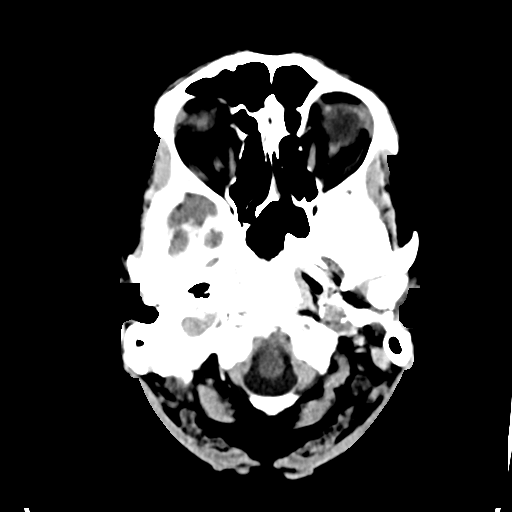
[im 4/31  bone]
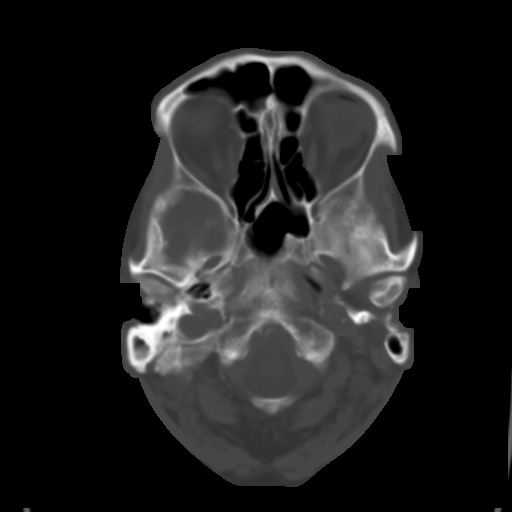
[im 8/31  brain]
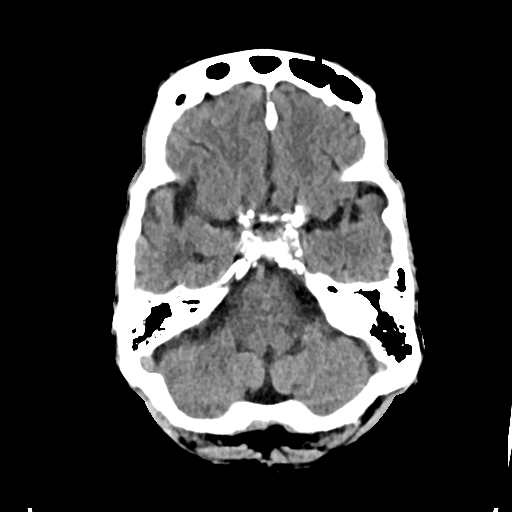
[im 12/31  brain]
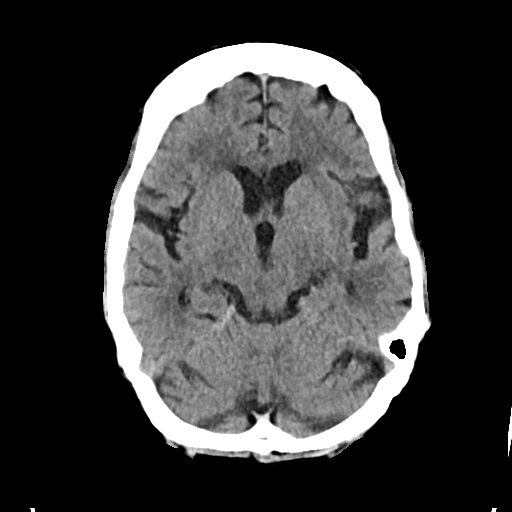
[im 16/31  brain]
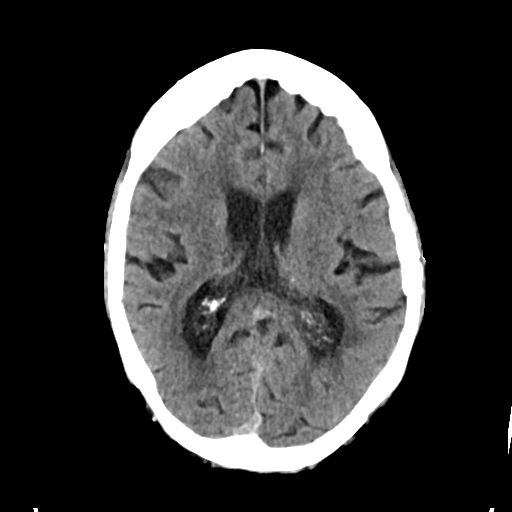
[im 19/31  brain]
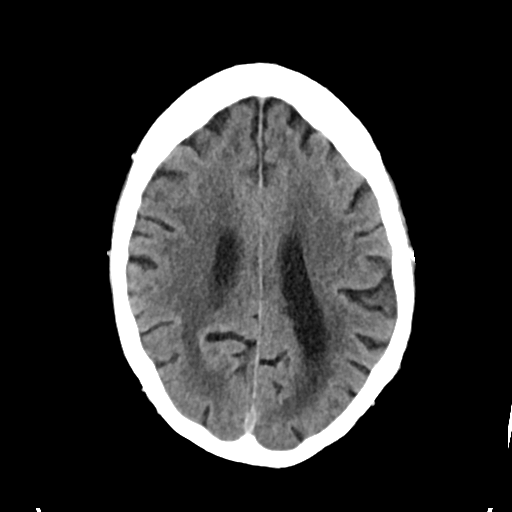
[im 19/31  bone]
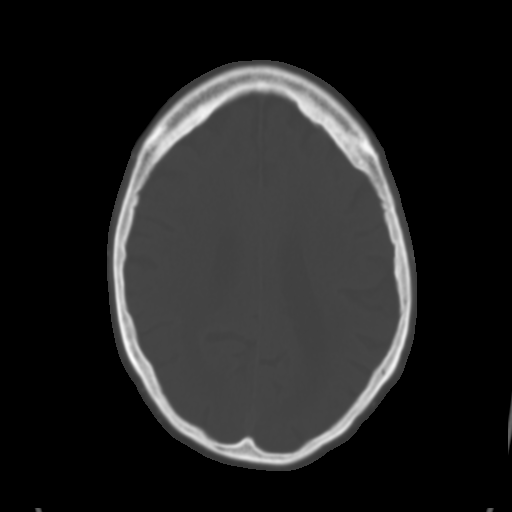
[im 23/31  brain]
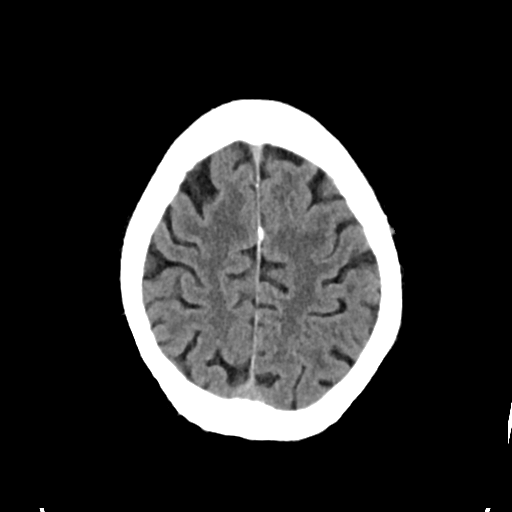
[im 27/31  brain]
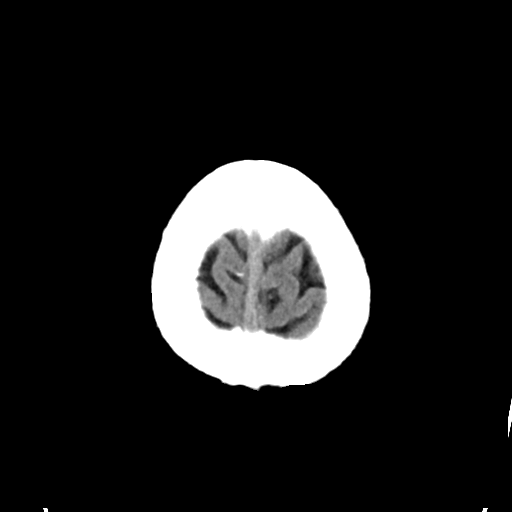

[Series 4: head bone · axial · 0.44mm/px · z∈[-96,-66]mm · 3 of 77 slices shown]
[im 8/77  bone]
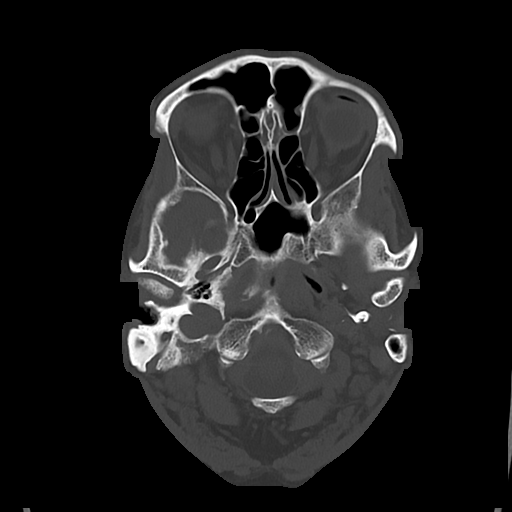
[im 16/77  bone]
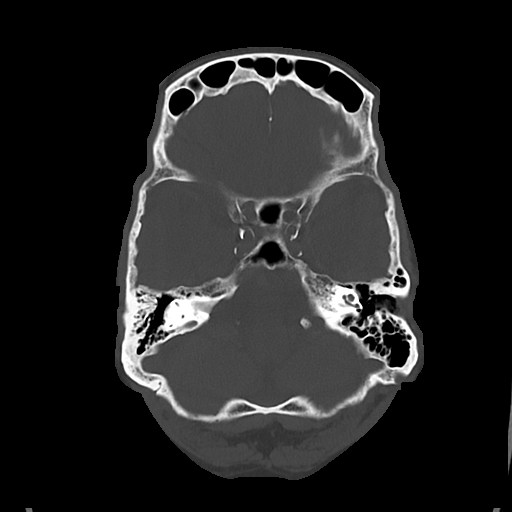
[im 23/77  bone]
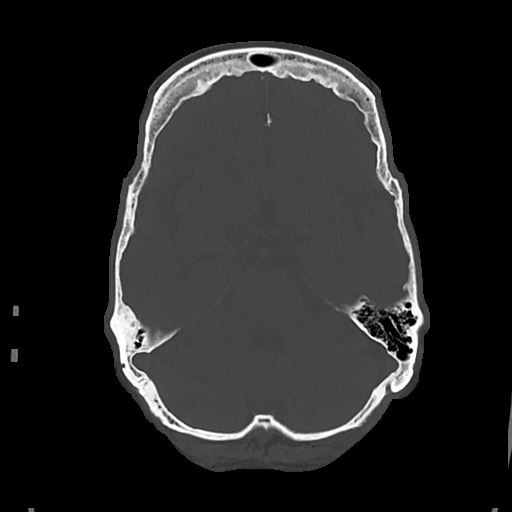

[Series 5: cor soft · coronal · 0.35mm/px · 3 of 68 slices shown]
[im 23/68  brain]
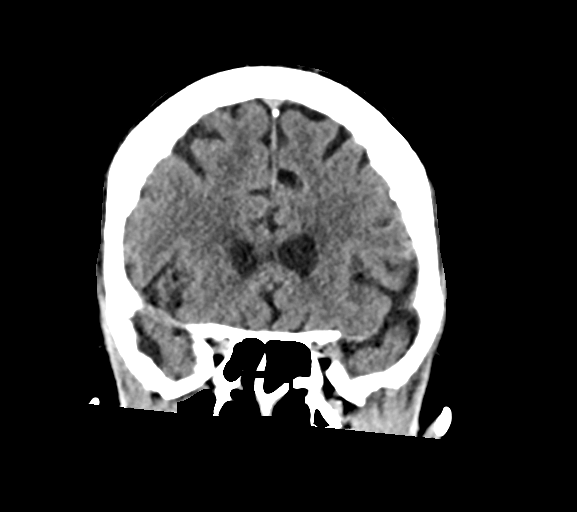
[im 30/68  brain]
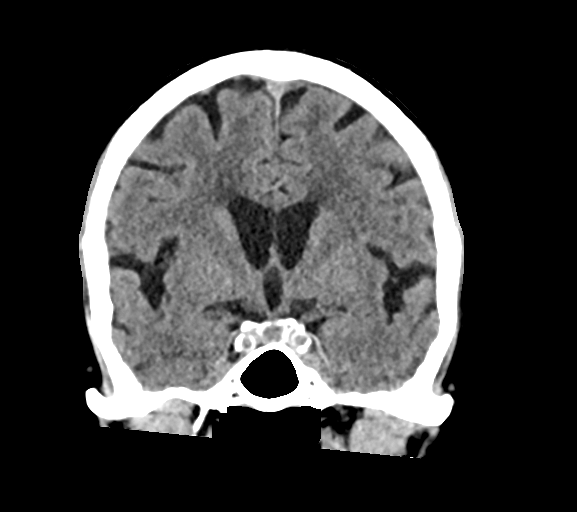
[im 38/68  brain]
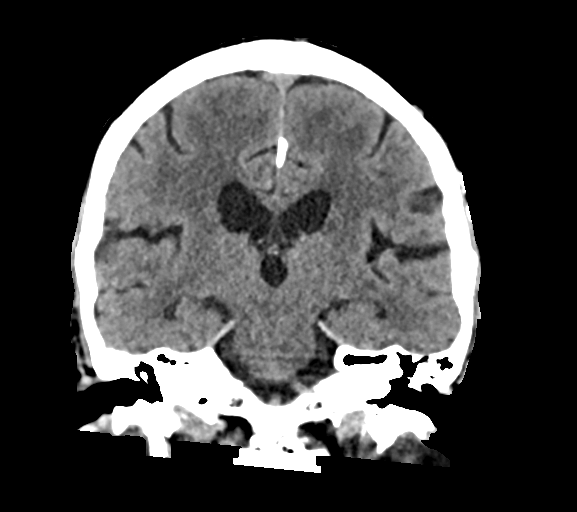

[Series 6: sag soft · sagittal · 0.36mm/px · 3 of 53 slices shown]
[im 18/53  brain]
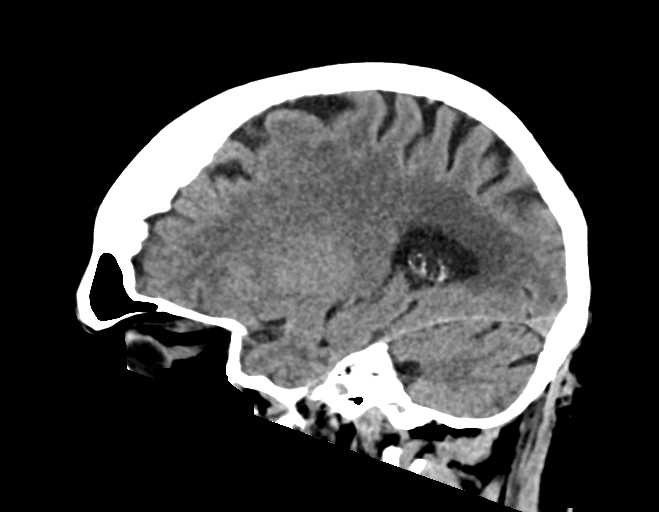
[im 27/53  brain]
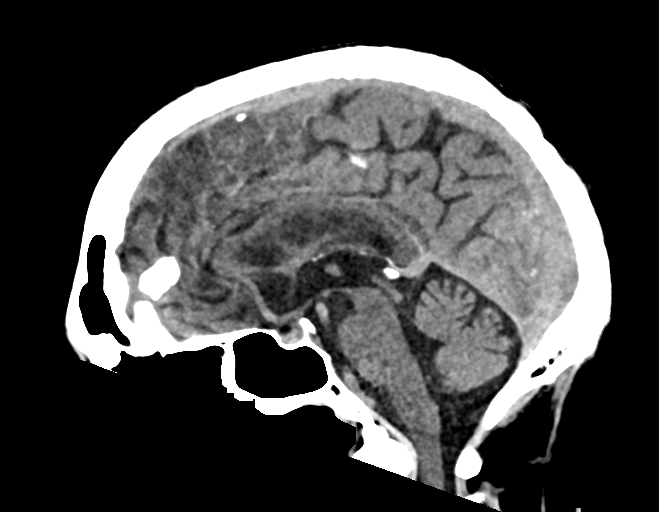
[im 35/53  brain]
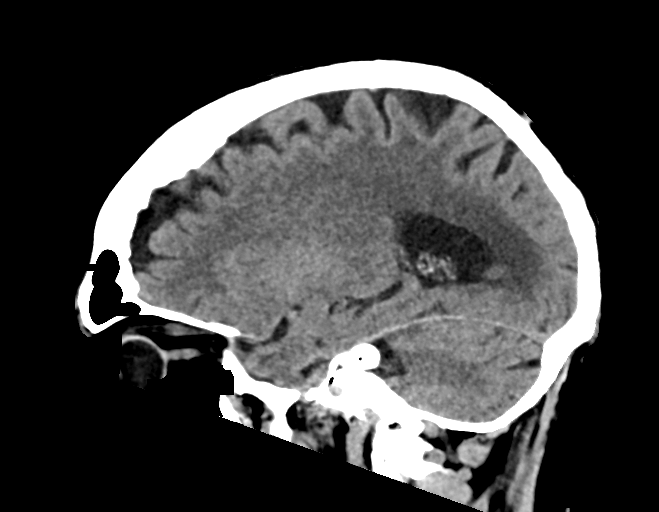

[16 of 47 positions shown; findings below may reference images not displayed]

FINDINGS: CT HEAD FINDINGS

Brain: Chronic white matter ischemic change. No evidence of acute
infarction, hemorrhage, hydrocephalus, extra-axial collection or
mass lesion/mass effect.

Vascular: No hyperdense vessel or unexpected calcification.

Skull: Normal. Negative for fracture or focal lesion.

Sinuses/Orbits: No acute finding.

Other: None.

CT CERVICAL SPINE FINDINGS

Alignment: Normal.

Skull base and vertebrae: No acute fracture. No primary bone lesion
or focal pathologic process.

Soft tissues and spinal canal: No prevertebral fluid or swelling. No
visible canal hematoma.

Disc levels: Moderate multilevel degenerative disc disease, most
severe at C6-C7.

Upper chest: Negative.

Other: None.
IMPRESSION: 1. No acute intracranial abnormality.
2. No evidence of cervical spine fracture or traumatic malalignment.
3. Moderate multilevel degenerative disc disease.

## 2021-11-28 IMAGING — CT CT VENOGRAM HEAD
1 of 7 series · 6 of 47 positions shown · IV contrast (APPLIED)
Comparison: No prior CTV, correlation is made with CT head
[DATE].

CLINICAL DATA: Dural venous sinus thrombosis suspected, headache,
neck pain

EXAM:
CT VENOGRAM HEAD
TECHNIQUE: Venographic phase images of the brain were obtained following the
administration of intravenous contrast. Multiplanar reformats and
maximum intensity projections were generated.

[Series 3: head with 2.0 h30s · axial · 0.42mm/px · z∈[+79,+191]mm · 6 of 80 slices shown]
[im 12/80  brain]
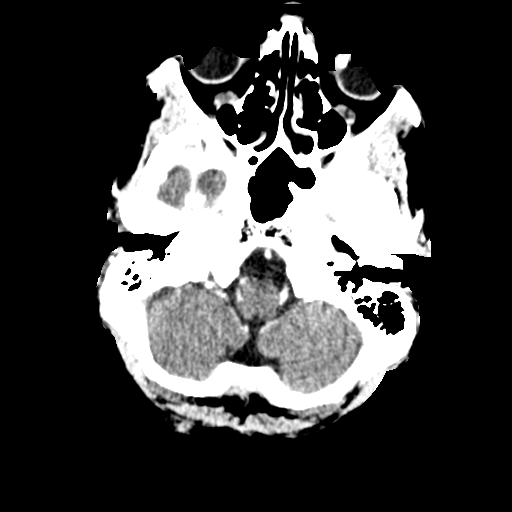
[im 23/80  bone]
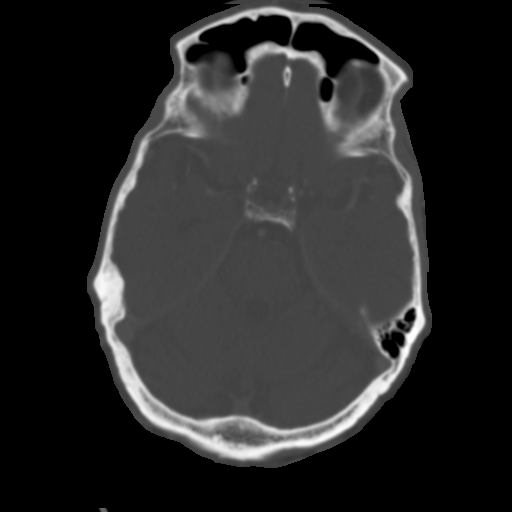
[im 34/80  brain]
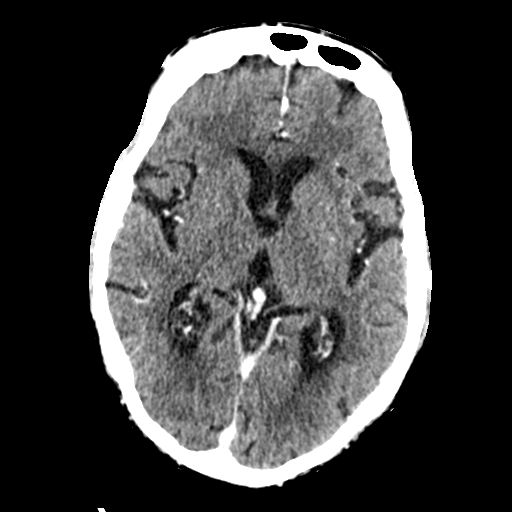
[im 46/80  bone]
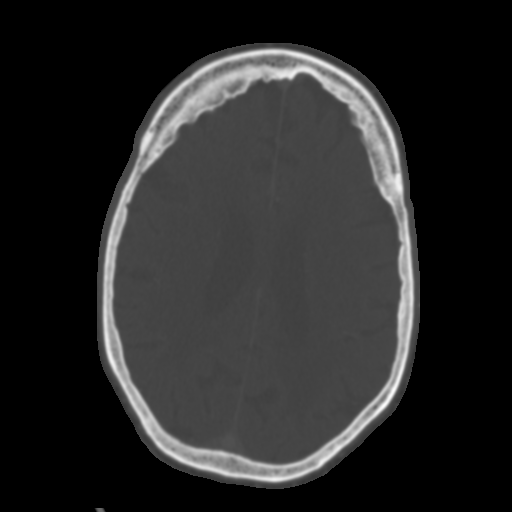
[im 57/80  brain]
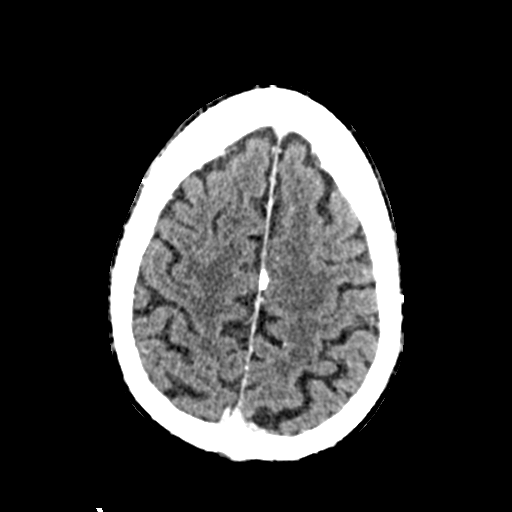
[im 68/80  bone]
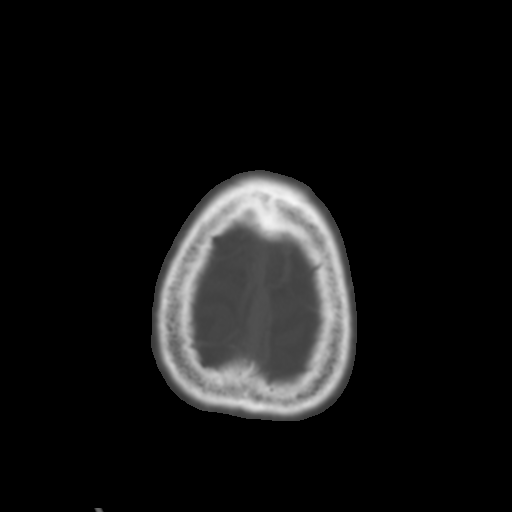

[6 of 47 positions shown; findings below may reference images not displayed]

RADIATION DOSE REDUCTION: This exam was performed according to the
departmental dose-optimization program which includes automated
exposure control, adjustment of the mA and/or kV according to
patient size and/or use of iterative reconstruction technique.

CONTRAST:  75mL OMNIPAQUE IOHEXOL 350 MG/ML SOLN
FINDINGS: Superior sagittal sinus: Normal.

Straight sinus: Normal.

Inferior sagittal sinus, vein of LIENAD and internal cerebral veins:
Normal.

Transverse sinuses: Normal.

Sigmoid sinuses: Normal.

Visualized jugular veins: Normal
IMPRESSION: No evidence of dural venous sinus thrombosis or stenosis.

## 2021-11-28 IMAGING — CT CT CERVICAL SPINE W/O CM
3 of 4 series · 13 of 33 positions shown, 16 images · non-contrast
Comparison: None.

CLINICAL DATA: Head and neck pain, no history of trauma



[Series 8: sag bone · sagittal · 0.36mm/px · 5 of 56 slices shown, 6 images]
[im 19/56  bone]
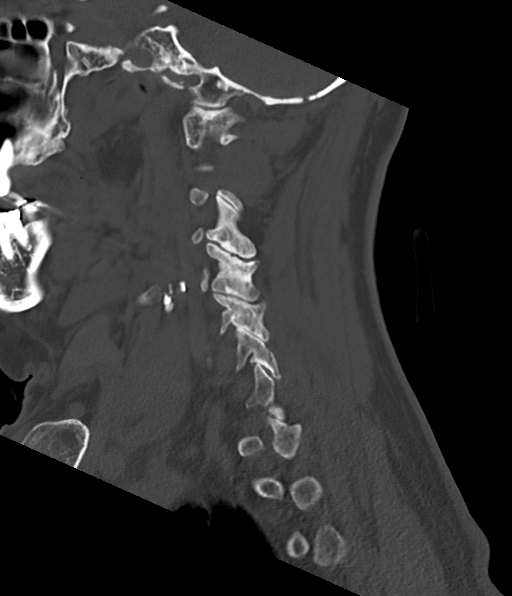
[im 23/56  bone]
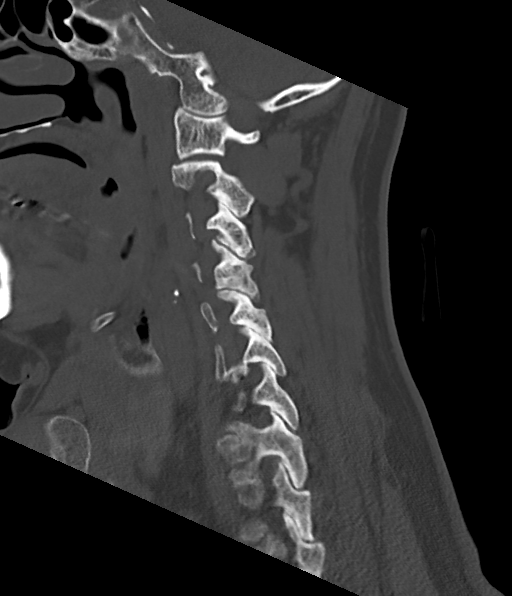
[im 28/56  soft-tissue]
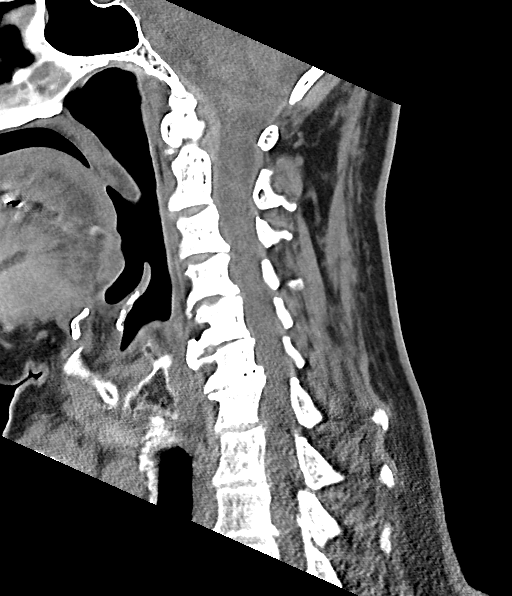
[im 28/56  bone]
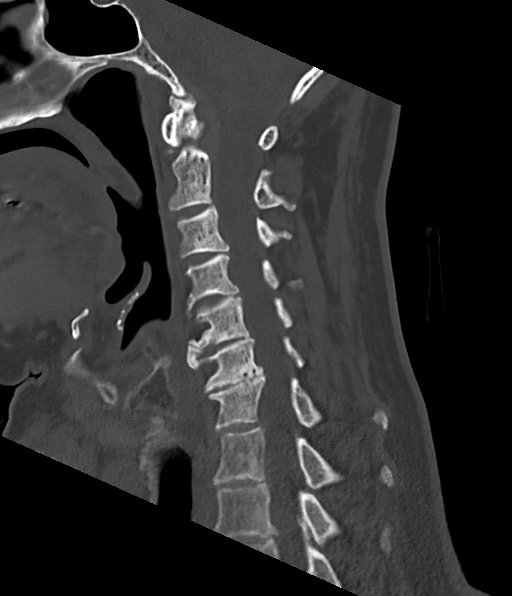
[im 33/56  bone]
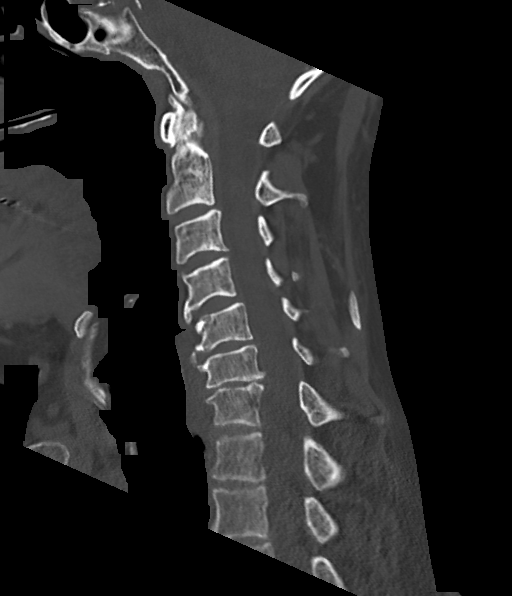
[im 37/56  bone]
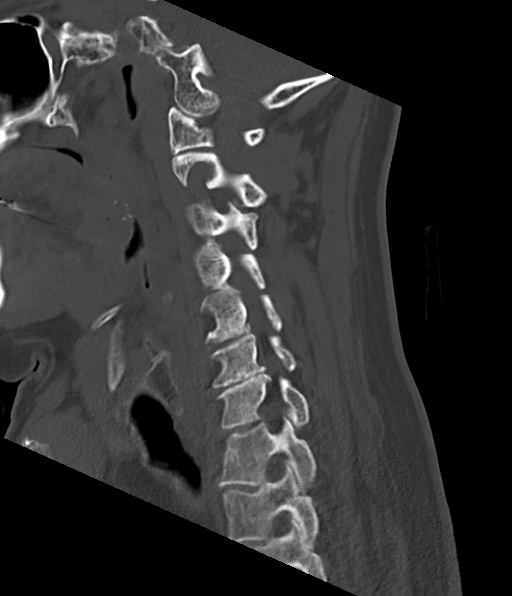

[Series 9: cor bone · coronal · 0.34mm/px · 3 of 54 slices shown]
[im 12/54  bone]
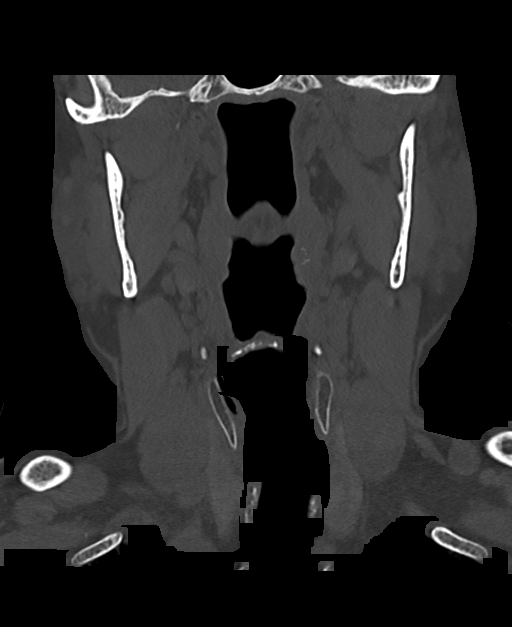
[im 22/54  bone]
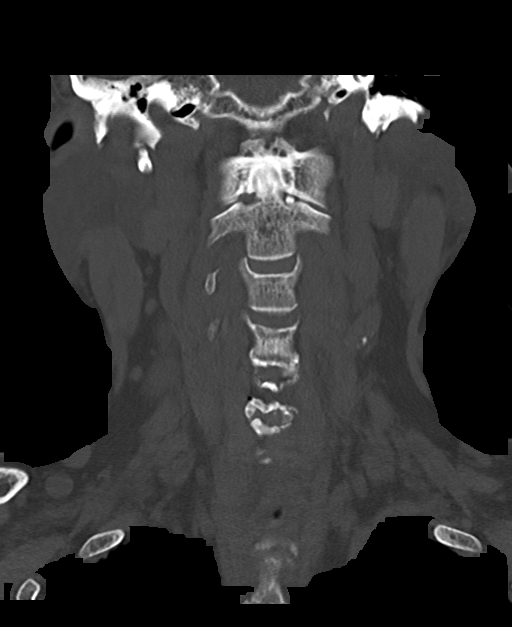
[im 32/54  bone]
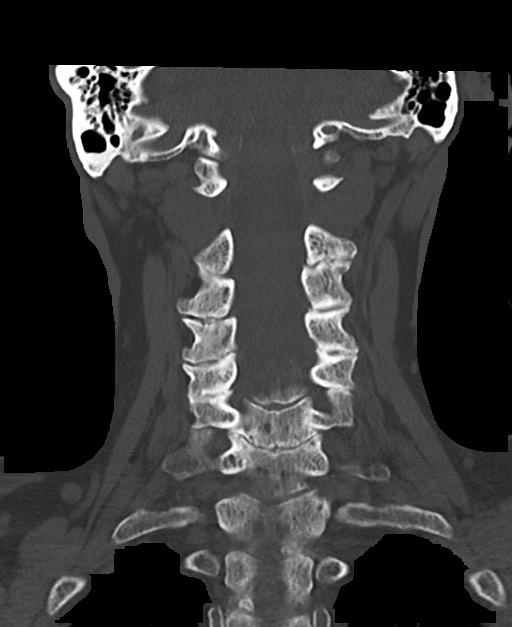

[Series 10: orthogonal axials · axial · 0.21mm/px · z∈[-221,-129]mm · 5 of 89 slices shown, 7 images]
[im 15/89  soft-tissue]
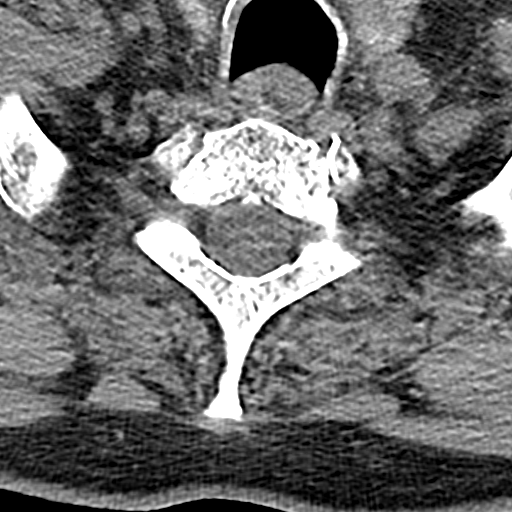
[im 15/89  bone]
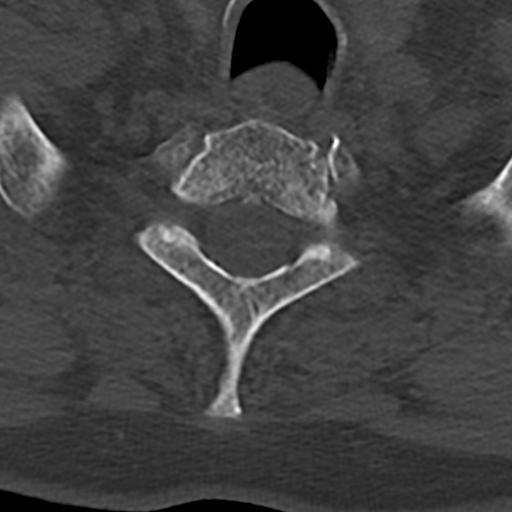
[im 30/89  bone]
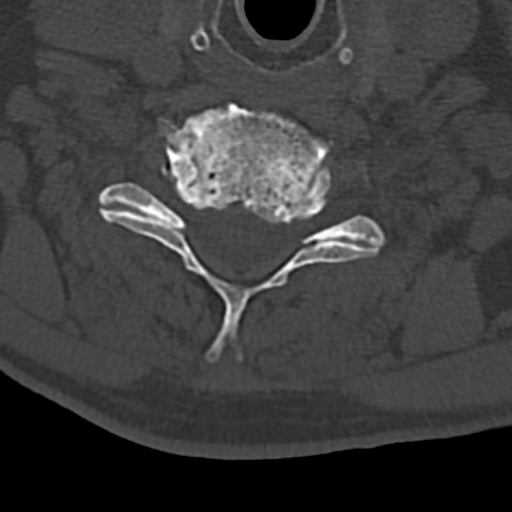
[im 45/89  bone]
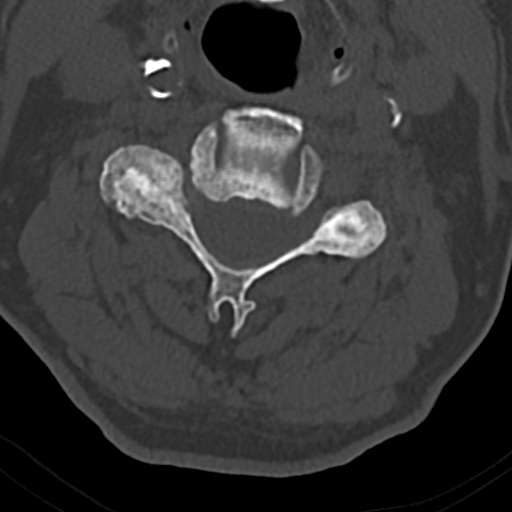
[im 59/89  bone]
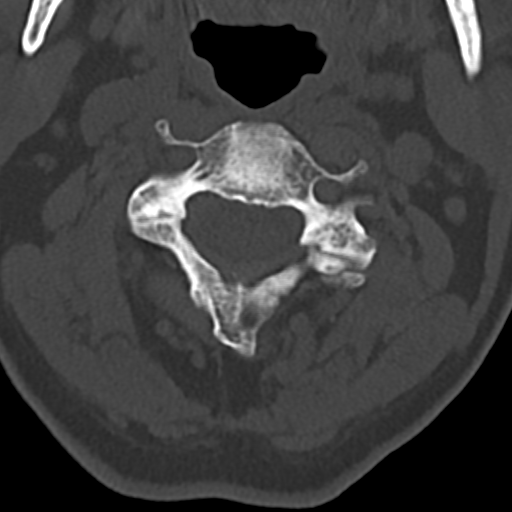
[im 74/89  soft-tissue]
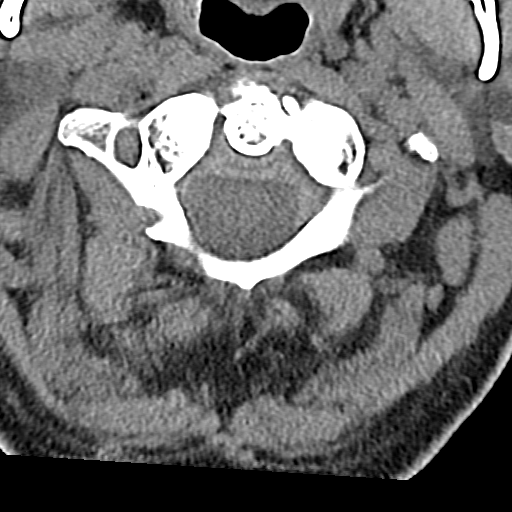
[im 74/89  bone]
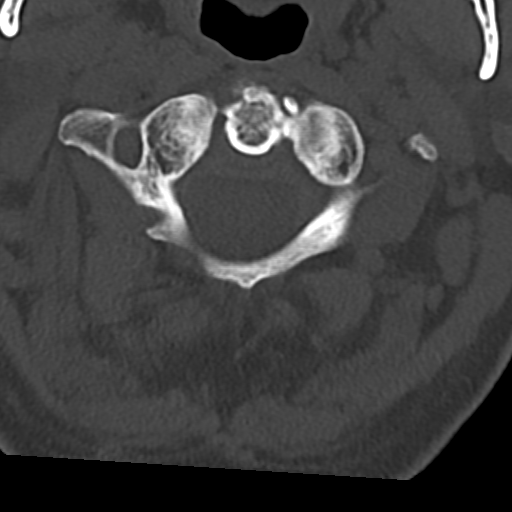

[13 of 33 positions shown; findings below may reference images not displayed]

FINDINGS: CT HEAD FINDINGS

Brain: Chronic white matter ischemic change. No evidence of acute
infarction, hemorrhage, hydrocephalus, extra-axial collection or
mass lesion/mass effect.

Vascular: No hyperdense vessel or unexpected calcification.

Skull: Normal. Negative for fracture or focal lesion.

Sinuses/Orbits: No acute finding.

Other: None.

CT CERVICAL SPINE FINDINGS

Alignment: Normal.

Skull base and vertebrae: No acute fracture. No primary bone lesion
or focal pathologic process.

Soft tissues and spinal canal: No prevertebral fluid or swelling. No
visible canal hematoma.

Disc levels: Moderate multilevel degenerative disc disease, most
severe at C6-C7.

Upper chest: Negative.

Other: None.
IMPRESSION: 1. No acute intracranial abnormality.
2. No evidence of cervical spine fracture or traumatic malalignment.
3. Moderate multilevel degenerative disc disease.

## 2021-11-28 IMAGING — MR MR HEAD W/O CM
12 of 13 series · 44 of 48 positions shown · non-contrast
Comparison: No prior MRI, correlation is made with CT head
[DATE]

CLINICAL DATA: Stroke suspected

EXAM:
MRI HEAD WITHOUT CONTRAST
TECHNIQUE: Multiplanar, multiecho pulse sequences of the brain and surrounding
structures were obtained without intravenous contrast.

[Series 5: DWI · axial · 3.0mm · 0.88mm/px · z∈[-58,+80]mm · 7 of 96 slices shown (1 of 4)]
[im 1/96]
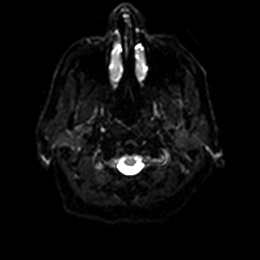
[im 16/96]
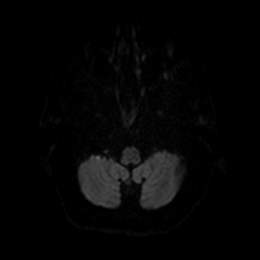
[im 32/96]
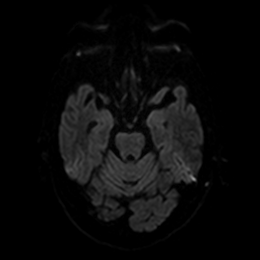
[im 48/96]
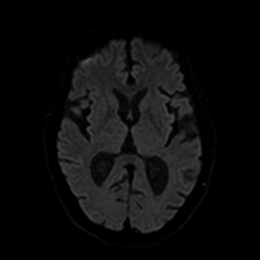
[im 64/96]
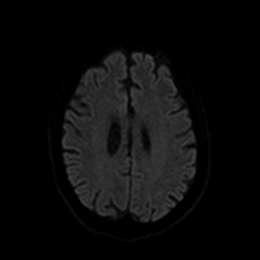
[im 80/96]
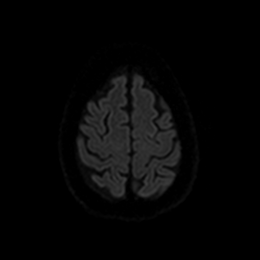
[im 96/96]
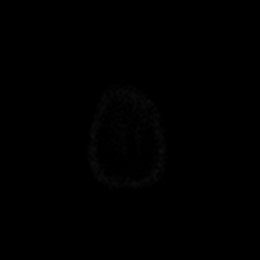

[Series 6: DWI · axial · 3.0mm · 0.88mm/px · z∈[-58,+80]mm · 4 of 48 slices shown (2 of 4)]
[im 1/48]
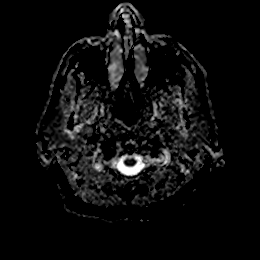
[im 16/48]
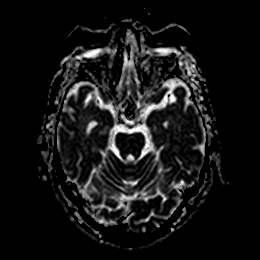
[im 32/48]
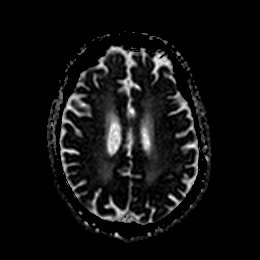
[im 48/48]
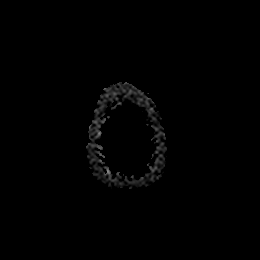

[Series 7: DWI · coronal · 4.0mm · 0.88mm/px · 6 of 76 slices shown (3 of 4)]
[im 1/76]
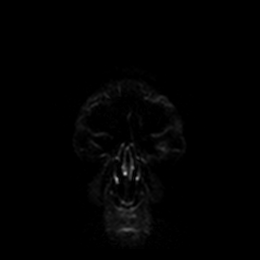
[im 16/76]
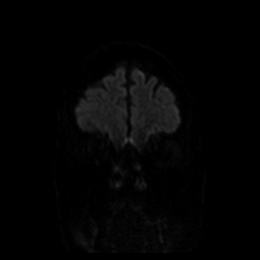
[im 31/76]
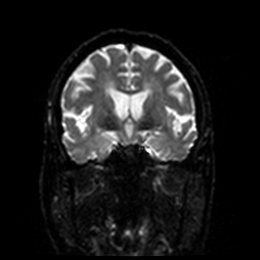
[im 46/76]
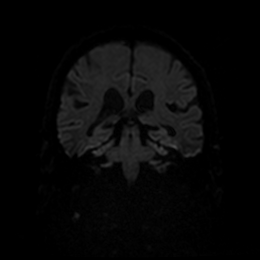
[im 61/76]
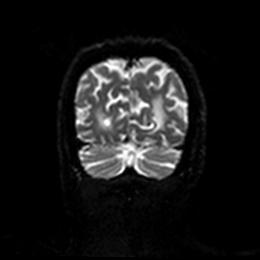
[im 76/76]
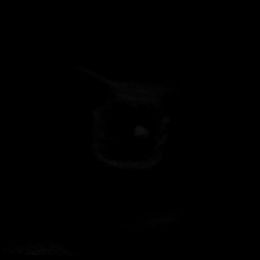

[Series 8: DWI · coronal · 4.0mm · 0.88mm/px · 3 of 38 slices shown (4 of 4)]
[im 1/38]
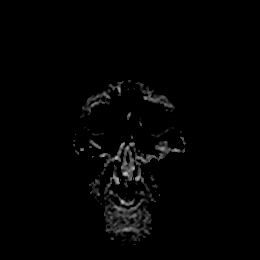
[im 19/38]
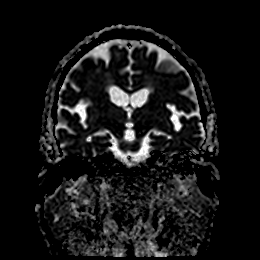
[im 38/38]
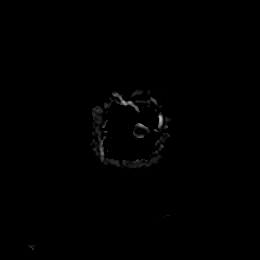

[Series 9: T1 · sagittal · 5.0mm · 0.78mm/px · 2 of 25 slices shown]
[im 1/25]
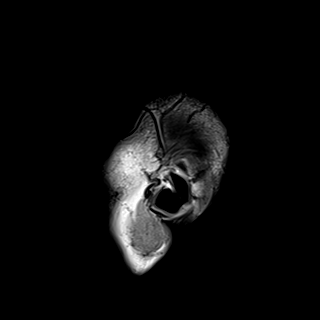
[im 25/25]
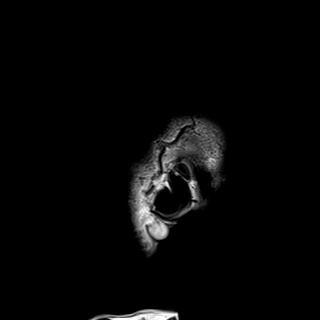

[Series 10: T2 · axial · 5.0mm · 0.72mm/px · z∈[-66,+74]mm · 2 of 25 slices shown (1 of 2)]
[im 1/25]
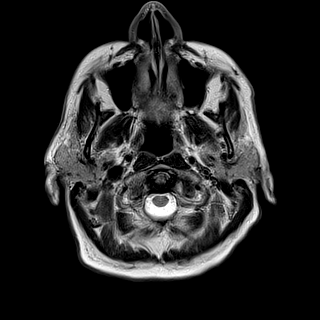
[im 25/25]
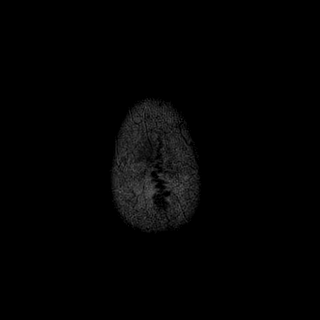

[Series 11: FLAIR · axial · 5.0mm · 0.45mm/px · z∈[-65,+75]mm · 2 of 25 slices shown]
[im 1/25]
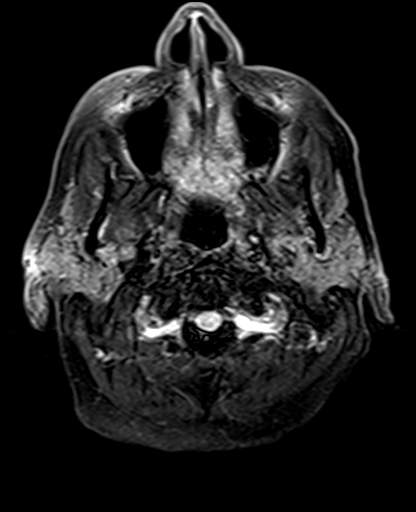
[im 25/25]
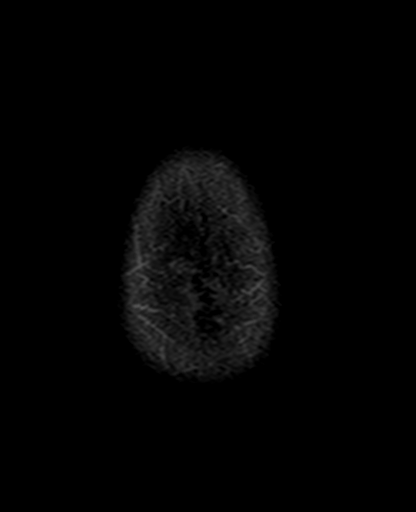

[Series 12: mag_images · axial · 3.0mm · 0.90mm/px · z∈[-80,+81]mm · 4 of 56 slices shown]
[im 1/56]
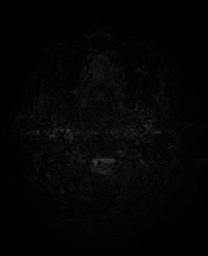
[im 19/56]
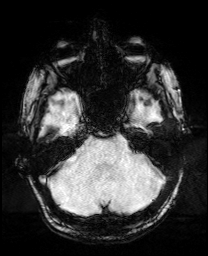
[im 37/56]
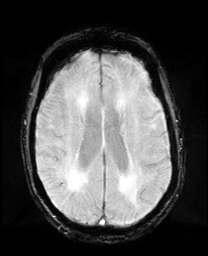
[im 56/56]
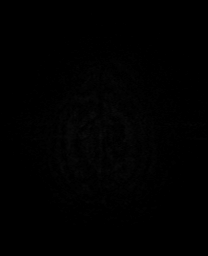

[Series 13: pha_images · axial · 3.0mm · 0.90mm/px · z∈[-77,+81]mm · 4 of 53 slices shown]
[im 1/53]
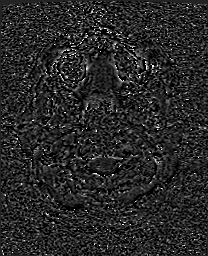
[im 18/53]
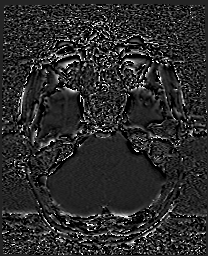
[im 35/53]
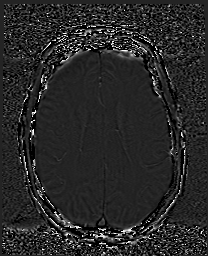
[im 53/53]
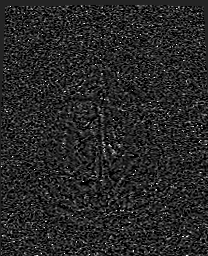

[Series 14: swi_images · axial · 3.0mm · 0.90mm/px · z∈[-80,+81]mm · 4 of 56 slices shown]
[im 1/56]
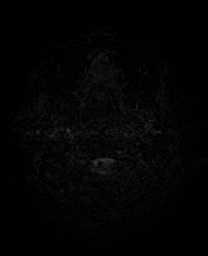
[im 19/56]
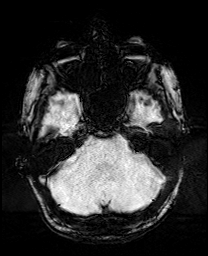
[im 37/56]
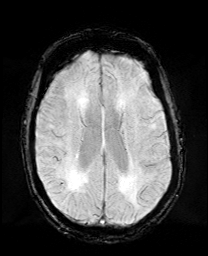
[im 56/56]
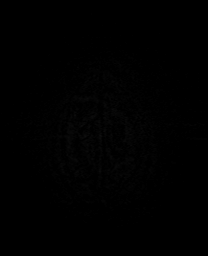

[Series 15: mip_images(sw) · axial · 24.0mm · 0.90mm/px · z∈[-70,+71]mm · 4 of 49 slices shown]
[im 1/49]
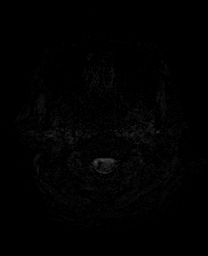
[im 17/49]
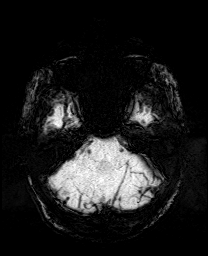
[im 33/49]
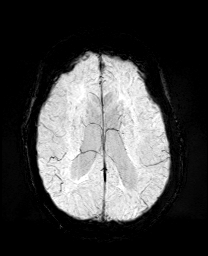
[im 49/49]
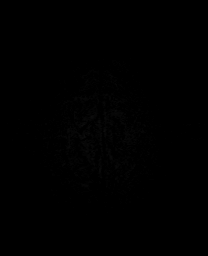

[Series 17: T2 · coronal · 5.0mm · 0.34mm/px · 2 of 33 slices shown (2 of 2)]
[im 1/33]
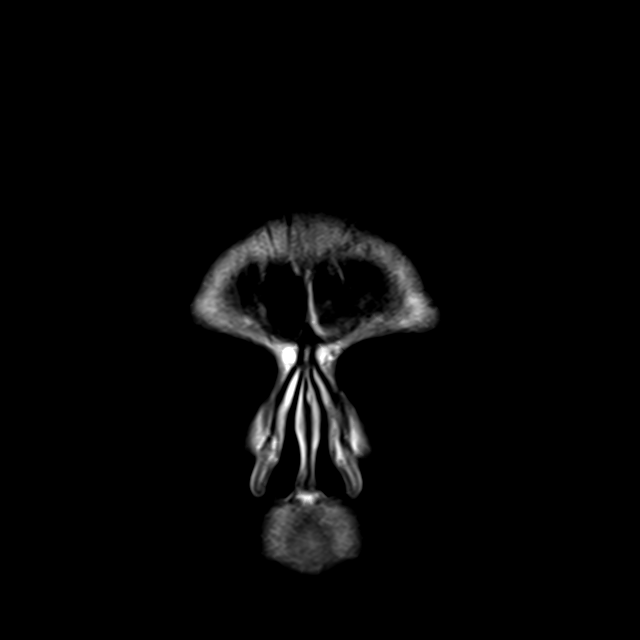
[im 33/33]
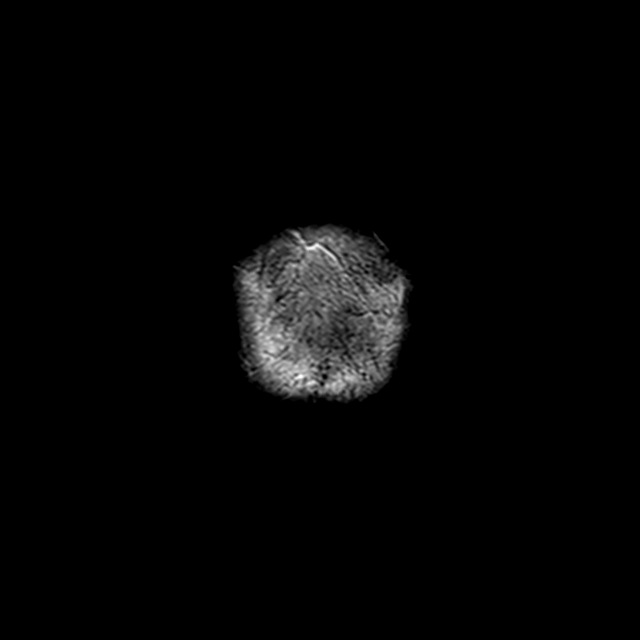

[44 of 48 positions shown; findings below may reference images not displayed]

FINDINGS: Brain: No restricted diffusion to suggest acute or subacute infarct.
No acute hemorrhage, mass, mass effect, or midline shift. No foci of
hemosiderin deposition to suggest remote hemorrhage. No
hydrocephalus or extra-axial collection. Cerebral volume is likely
within normal limits for age. Confluent T2 hyperintense signal in
the periventricular white matter, likely the sequela of moderate
chronic small vessel ischemic disease.

Vascular: Normal flow voids.

Skull and upper cervical spine: Normal marrow signal.

Sinuses/Orbits: No acute finding. Status post bilateral lens
replacements.

Other: Trace fluid in the right mastoid air cells.
IMPRESSION: No acute intracranial process. No evidence of acute or subacute
infarct.

## 2021-11-28 IMAGING — CT CT ANGIO HEAD-NECK (W OR W/O PERF)
2 of 7 series · 8 of 33 positions shown · IV contrast (APPLIED)
Comparison: No prior CTA, correlation is made with [DATE] CT
head

CLINICAL DATA: Headache, right hand pain

EXAM:
CT ANGIOGRAPHY HEAD AND NECK
TECHNIQUE: Multidetector CT imaging of the head and neck was performed using
the standard protocol during bolus administration of intravenous
contrast. Multiplanar CT image reconstructions and MIPs were
obtained to evaluate the vascular anatomy. Carotid stenosis
measurements (when applicable) are obtained utilizing NASCET
criteria, using the distal internal carotid diameter as the
denominator.

[Series 5: cta neck/head · axial · 0.52mm/px · z∈[-154,-50]mm · 2 of 158 slices shown]
[im 53/158  soft-tissue]
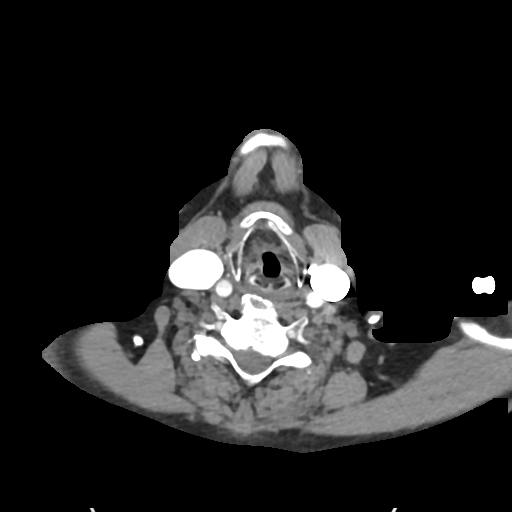
[im 105/158  soft-tissue]
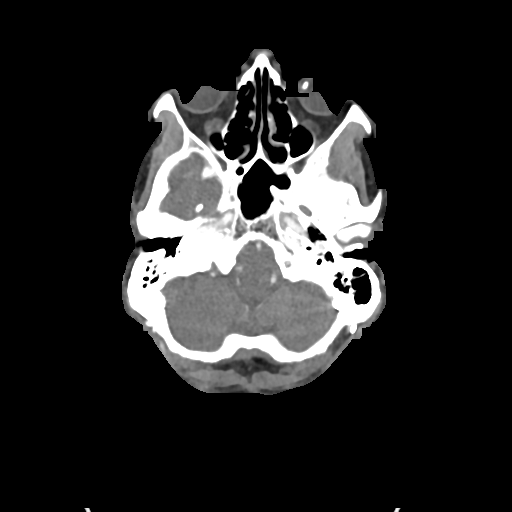

[Series 7: ax thins · axial · 0.39mm/px · z∈[-212,+13]mm · 6 of 317 slices shown]
[im 46/317  soft-tissue]
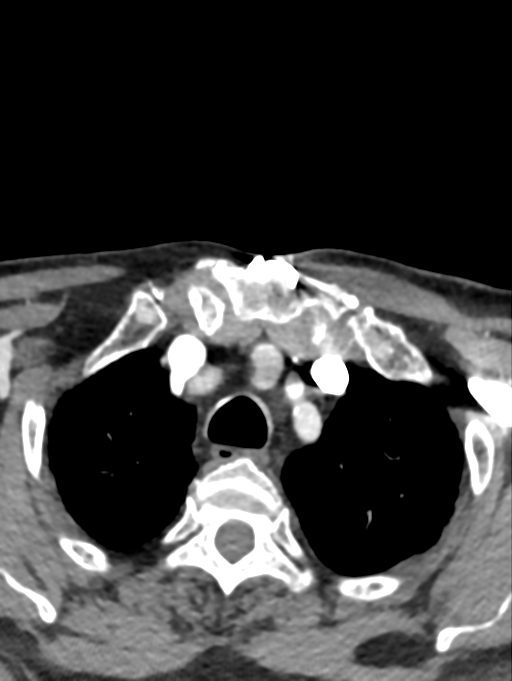
[im 91/317  bone]
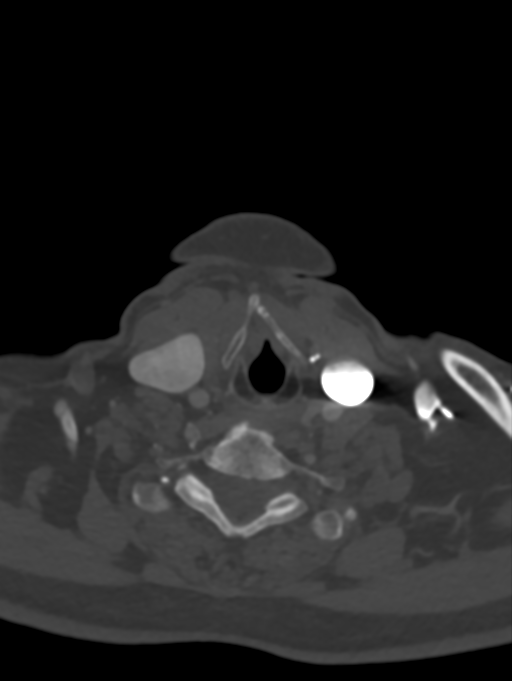
[im 136/317  soft-tissue]
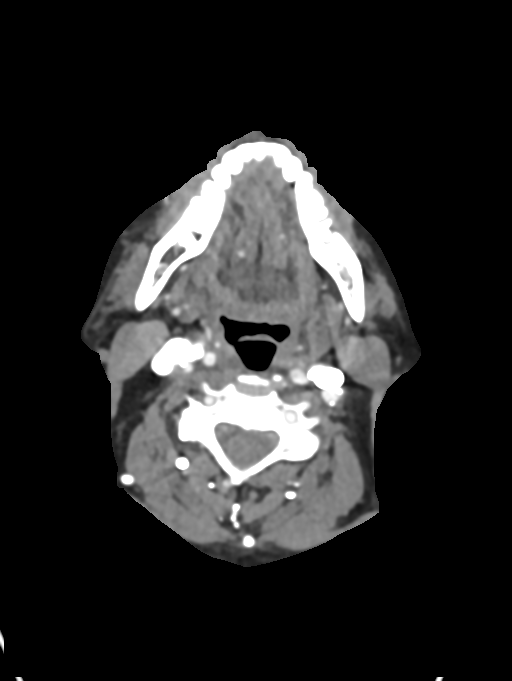
[im 181/317  bone]
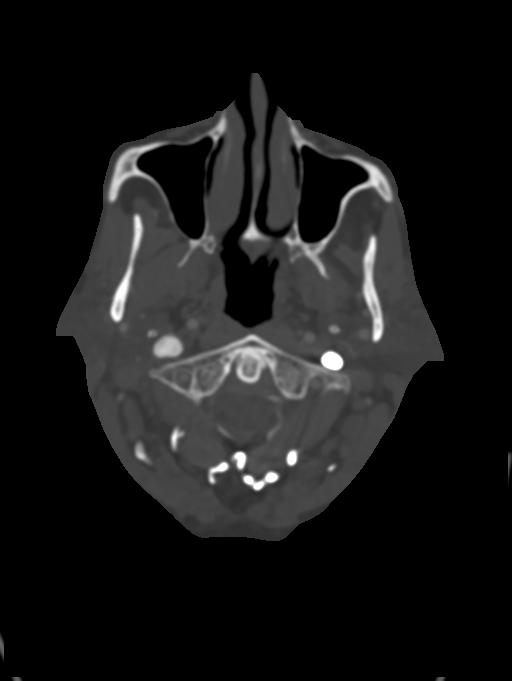
[im 226/317  soft-tissue]
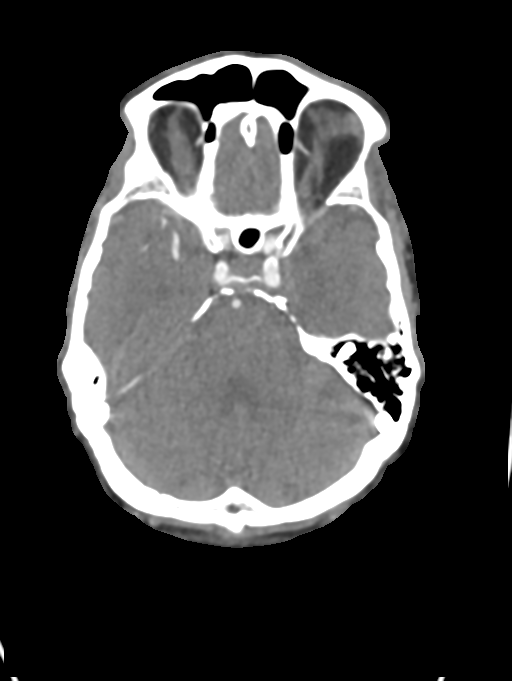
[im 271/317  bone]
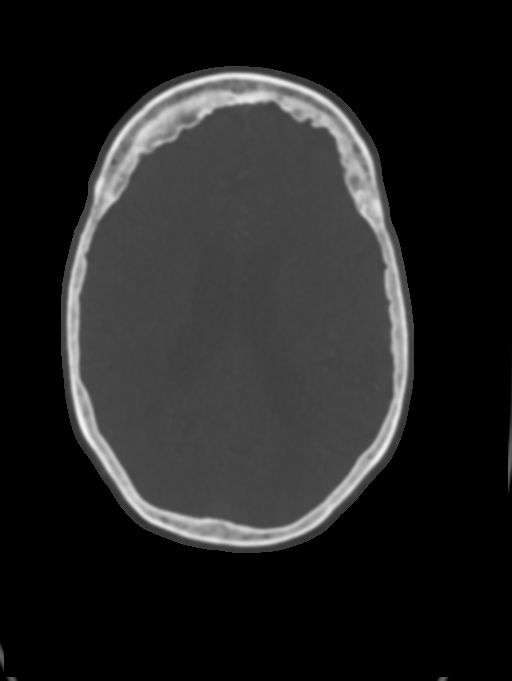

[8 of 33 positions shown; findings below may reference images not displayed]

RADIATION DOSE REDUCTION: This exam was performed according to the
departmental dose-optimization program which includes automated
exposure control, adjustment of the mA and/or kV according to
patient size and/or use of iterative reconstruction technique.

CONTRAST:  75mL OMNIPAQUE IOHEXOL 350 MG/ML SOLN
FINDINGS: CT HEAD FINDINGS

For noncontrast findings, please see same day CT head.

CTA NECK FINDINGS

Aortic arch: Standard branching. Imaged portion shows no evidence of
aneurysm or dissection. No significant stenosis of the major arch
vessel origins. Aortic atherosclerosis.

Right carotid system: No evidence of dissection, occlusion, or
hemodynamically significant stenosis (greater than 50%). Plaque at
the bifurcation and in the proximal right ICA is not hemodynamically
significant.

Left carotid system: No evidence of dissection, occlusion, or
hemodynamically significant stenosis (greater than 50%). Plaque at
the bifurcation and in the proximal left ICA is not hemodynamically
significant.

Vertebral arteries: Left dominant. No evidence of dissection,
occlusion, or hemodynamically significant stenosis (greater than
50%). Mild compression of the right vertebral artery by degenerative
changes as it passes by the right neural foramen at C3-C4.

Skeleton: No acute osseous abnormality. Degenerative changes in the
cervical spine, with disc height loss, most prominent at C6-C7 and
multilevel neural foraminal narrowing

Other neck: No acute finding.

Upper chest: Focal pulmonary opacity or pleural effusion. Air
trapping.

Review of the MIP images confirms the above findings

CTA HEAD FINDINGS

Anterior circulation: Both internal carotid arteries are patent to
the termini, with calcifications in the bilateral cavernous and
supraclinoid portions that cause mild narrowing in the left
supraclinoid ICA and moderate narrowing in the right supraclinoid
ICA.

A1 segments patent. Normal anterior communicating artery. Anterior
cerebral arteries are patent to their distal aspects.

No M1 stenosis or occlusion. Normal MCA bifurcations. Distal MCA
branches perfused and symmetric.

Posterior circulation: Vertebral arteries patent to the
vertebrobasilar junction with mild narrowing of the right V4 just
proximal to the right PICA takeoff. Posterior inferior cerebral
arteries patent bilaterally.

Basilar patent to its distal aspect. Superior cerebellar arteries
patent bilaterally.

Patent P1 segments. Moderate narrowing in the proximal right P2
(series 7, image 86) and P3 segment (series 7, image 85). The left
PCA is perfused to their distal aspects without stenosis. The
bilateral posterior communicating arteries are not visualized.

Venous sinuses: As permitted by contrast timing, patent.

Anatomic variants: None significant.

Review of the MIP images confirms the above findings
IMPRESSION: 1. No intracranial large vessel occlusion. Moderate narrowing in the
right supraclinoid ICA and right PCA, with mild narrowing in the
left supraclinoid ICA and right V4.
2.  No hemodynamically significant stenosis in the neck.

## 2021-11-28 MED ORDER — KETOROLAC TROMETHAMINE 30 MG/ML IJ SOLN
15.0000 mg | Freq: Once | INTRAMUSCULAR | Status: AC
  Administered 2021-11-28: 15 mg via INTRAVENOUS
  Filled 2021-11-28: qty 1

## 2021-11-28 MED ORDER — IOHEXOL 350 MG/ML SOLN
75.0000 mL | Freq: Once | INTRAVENOUS | Status: AC | PRN
  Administered 2021-11-28: 75 mL via INTRAVENOUS

## 2021-11-28 MED ORDER — ENOXAPARIN SODIUM 40 MG/0.4ML IJ SOSY
40.0000 mg | PREFILLED_SYRINGE | INTRAMUSCULAR | Status: DC
  Administered 2021-11-29 (×2): 40 mg via SUBCUTANEOUS
  Filled 2021-11-28 (×2): qty 0.4

## 2021-11-28 MED ORDER — SODIUM CHLORIDE 0.9% FLUSH
3.0000 mL | Freq: Two times a day (BID) | INTRAVENOUS | Status: DC
  Administered 2021-11-29 (×3): 3 mL via INTRAVENOUS

## 2021-11-28 MED ORDER — KETOROLAC TROMETHAMINE 15 MG/ML IJ SOLN
15.0000 mg | Freq: Four times a day (QID) | INTRAMUSCULAR | Status: DC | PRN
  Administered 2021-11-30: 15 mg via INTRAVENOUS
  Filled 2021-11-28: qty 1

## 2021-11-28 MED ORDER — INSULIN ASPART 100 UNIT/ML IJ SOLN
0.0000 [IU] | Freq: Every day | INTRAMUSCULAR | Status: DC
  Administered 2021-11-29: 2 [IU] via SUBCUTANEOUS

## 2021-11-28 MED ORDER — ADULT MULTIVITAMIN W/MINERALS CH
1.0000 | ORAL_TABLET | Freq: Every day | ORAL | Status: DC
  Administered 2021-11-28 – 2021-11-30 (×3): 1 via ORAL
  Filled 2021-11-28 (×3): qty 1

## 2021-11-28 MED ORDER — INSULIN ASPART 100 UNIT/ML IJ SOLN
0.0000 [IU] | Freq: Three times a day (TID) | INTRAMUSCULAR | Status: DC
  Administered 2021-11-29: 1 [IU] via SUBCUTANEOUS
  Administered 2021-11-29: 3 [IU] via SUBCUTANEOUS
  Administered 2021-11-29 – 2021-11-30 (×2): 1 [IU] via SUBCUTANEOUS

## 2021-11-28 MED ORDER — PREDNISONE 20 MG PO TABS
60.0000 mg | ORAL_TABLET | Freq: Once | ORAL | Status: AC
  Administered 2021-11-28: 60 mg via ORAL
  Filled 2021-11-28: qty 3

## 2021-11-28 NOTE — Assessment & Plan Note (Signed)
1. Hold metformin due to lactate of 2.7 ?2. Sensitive SSI AC/HS ?

## 2021-11-28 NOTE — ED Notes (Signed)
Patient transported to CT 

## 2021-11-28 NOTE — Assessment & Plan Note (Addendum)
With arthritis of wrist / thumb.  No bony injury on X ray today. ?Also neck pain and headache since syncope earlier today. ?1. Wrist splint ordered ?2. Check uric acid ?3. No fever, WBC to suggest septic arthritis at this point ?4. Got prednisone and toradol in ED ?5. Will get MRI of R hand. ?

## 2021-11-28 NOTE — Assessment & Plan Note (Addendum)
2 episodes of decreased responsiveness: 1 with syncope, 1 with loss of function of R arm. ?1. MRI brain neg for acute stroke or other finding ?2. CTA head = mild-mod dz, no LVO ?3. CTA neck = no significant dz ?4. Getting CTV head to r/o dural venous thrombosis ?5. Getting EEG ?6. Check TSH ?7. Syncope pathway ?1. Admitting for obs ?2. Tele monitor ?3. 2d echo ?4. CXR ?8. Check UA ?9. Check trops ?

## 2021-11-28 NOTE — ED Provider Notes (Signed)
?[facility] ?Provider Note ? ? ?CSN: [id number] ?Arrival date & time: 11/28/21  1240 ? ?  ? ?History ? ?Chief Complaint  ?Patient presents with  ? Extremity Pain  ? ? ?Paul Villarreal is a 81 y.o. male. ? ?HPI ?Elderly male presenting with son for evaluation of multiple problems.  During the night the patient had a syncopal episode, during which she defecated.  It is unknown how long he was unconscious.  His son found him with fecal soiling and at that time the patient was complaining of pain in his right hand.  Since then the patient has also complained of neck pain and headache.  He had another episode of decreased responsiveness while he was eating breakfast this morning.  At that time he had sudden loss of use of his right hand and dropped some food onto his body.  This went on for a few minutes.  The son was ultimately able to bring the patient here by private vehicle, ambulatory. ?  ? ?Home Medications ?Prior to Admission medications   ?Medication Sig Start Date End Date Taking? Authorizing Provider  ?aspirin 81 MG tablet Take 81 mg by mouth daily.    [provider]  ?Blood Glucose Monitoring Suppl KIT by Does not apply route.    [provider]  ?Dexlansoprazole 30 MG capsule Take 1 capsule (30 mg total) by mouth daily before breakfast. 03/26/18   Rehman, Mechele Dawley, MD  ?gabapentin (NEURONTIN) 300 MG capsule Take 300 mg by mouth at bedtime.     [provider]  ?Glucose Blood (ACCU-CHEK AVIVA PLUS VI) 1 strip by In Vitro route daily.    [provider]  ?glucose blood test strip 1 each by Other route as needed for other. Use as instructed    [provider]  ?lansoprazole (PREVACID) 30 MG capsule Take 1 capsule (30 mg total) by mouth 2 (two) times daily before a meal. 04/13/18   Rehman, Mechele Dawley, MD  ?metFORMIN (GLUCOPHAGE) 500 MG tablet Take 1,000 mg by mouth daily with breakfast.     [provider]  ?metroNIDAZOLE  (FLAGYL) 250 MG tablet Take 1 tablet (250 mg total) by mouth 3 (three) times daily. 03/26/18   Rogene Houston, MD  ?Multiple Vitamin (MULTIVITAMIN WITH MINERALS) TABS tablet Take 1 tablet by mouth daily. Centrum Silver    [provider]  ?Omega-3 Fatty Acids (FISH OIL PO) Take 1 capsule by mouth daily.    [provider]  ?pantoprazole (PROTONIX) 40 MG tablet Take 1 tablet (40 mg total) by mouth daily. 04/13/18   Rogene Houston, MD  ?rosuvastatin (CRESTOR) 10 MG tablet Take 10 mg by mouth at bedtime.     [provider]  ?   ? ?Allergies    ?Oxycodone   ? ?Review of Systems   ?Review of Systems ? ?Physical Exam ?Updated Vital Signs ?BP 135/64   Pulse 84   Temp 98.3 ?F (36.8 ?C) (Oral)   Resp (!) 26   SpO2 96%  ?Physical Exam ?Vitals and nursing note reviewed.  ?Constitutional:   ?   General: He is not in acute distress. ?   Appearance: He is well-developed. He is not ill-appearing or diaphoretic.  ?HENT:  ?   Head: Normocephalic and atraumatic.  ?   Right Ear: External ear normal.  ?   Left Ear: External ear normal.  ?Eyes:  ?   Conjunctiva/sclera: Conjunctivae normal.  ?   Pupils:  Pupils are equal, round, and reactive to light.  ?Neck:  ?   Trachea: Phonation normal.  ?Cardiovascular:  ?   Rate and Rhythm: Normal rate and regular rhythm.  ?   Heart sounds: Normal heart sounds.  ?Pulmonary:  ?   Effort: Pulmonary effort is normal.  ?   Breath sounds: Normal breath sounds.  ?Abdominal:  ?   General: There is no distension.  ?   Palpations: Abdomen is soft.  ?   Tenderness: There is no abdominal tenderness.  ?Musculoskeletal:     ?   General: Normal range of motion.  ?   Cervical back: Normal range of motion and neck supple.  ?   Comments: Normal strength arms legs bilaterally.  Right hand, first metacarpal/carpal joint, tender swollen red consistent with localized arthritis.  Neurovascular tact distally in the right hand fingers.  ?Skin: ?   General: Skin is warm and dry.   ?Neurological:  ?   Mental Status: He is alert and oriented to person, place, and time.  ?   Cranial Nerves: No cranial nerve deficit.  ?   Sensory: No sensory deficit.  ?   Motor: No abnormal muscle tone.  ?   Coordination: Coordination normal.  ?   Comments: No dysarthria or aphasia.  ?Psychiatric:     ?   Mood and Affect: Mood normal.     ?   Behavior: Behavior normal.     ?   Thought Content: Thought content normal.     ?   Judgment: Judgment normal.  ? ? ?ED Results / Procedures / Treatments   ?Labs ?(all labs ordered are listed, but only abnormal results are displayed) ?Labs Reviewed  ?LACTIC ACID, PLASMA - Abnormal; Notable for the following components:  ?    Result Value  ? Lactic Acid, Venous 2.0 (*)   ? All other components within normal limits  ?LACTIC ACID, PLASMA - Abnormal; Notable for the following components:  ? Lactic Acid, Venous 2.7 (*)   ? All other components within normal limits  ?COMPREHENSIVE METABOLIC PANEL - Abnormal; Notable for the following components:  ? Sodium 128 (*)   ? CO2 18 (*)   ? Glucose, Bld 207 (*)   ? BUN 24 (*)   ? All other components within normal limits  ?CBC WITH DIFFERENTIAL/PLATELET - Abnormal; Notable for the following components:  ? WBC 12.8 (*)   ? Hemoglobin 12.5 (*)   ? HCT 38.3 (*)   ? Neutro Abs 11.3 (*)   ? Lymphs Abs 0.6 (*)   ? All other components within normal limits  ?TSH  ?URIC ACID  ? ? ?EKG ?None ? ? Date: 11/28/21 ? Rate: 78 ? Rhythm: normal sinus rhythm ? QRS Axis: normal ? PR and QT Intervals: PR prolonged ? ST/T Wave abnormalities: nonspecific T wave changes ? PR and QRS Conduction Disutrbances:first-degree A-V block  ?  ? ? ?Radiology ?CT ANGIO HEAD NECK W WO CM ? ?Result Date: 11/28/2021 ?CLINICAL DATA:  Headache, right hand pain EXAM: CT ANGIOGRAPHY HEAD AND NECK TECHNIQUE: Multidetector CT imaging of the head and neck was performed using the standard protocol during bolus administration of intravenous contrast. Multiplanar CT image  reconstructions and MIPs were obtained to evaluate the vascular anatomy. Carotid stenosis measurements (when applicable) are obtained utilizing NASCET criteria, using the distal internal carotid diameter as the denominator. RADIATION DOSE REDUCTION: This exam was performed according to the departmental dose-optimization program which includes automated exposure control, adjustment  of the mA and/or kV according to patient size and/or use of iterative reconstruction technique. CONTRAST:  31m OMNIPAQUE IOHEXOL 350 MG/ML SOLN COMPARISON:  No prior CTA, correlation is made with 11/28/2021 CT head FINDINGS: CT HEAD FINDINGS For noncontrast findings, please see same day CT head. CTA NECK FINDINGS Aortic arch: Standard branching. Imaged portion shows no evidence of aneurysm or dissection. No significant stenosis of the major arch vessel origins. Aortic atherosclerosis. Right carotid system: No evidence of dissection, occlusion, or hemodynamically significant stenosis (greater than 50%). Plaque at the bifurcation and in the proximal right ICA is not hemodynamically significant. Left carotid system: No evidence of dissection, occlusion, or hemodynamically significant stenosis (greater than 50%). Plaque at the bifurcation and in the proximal left ICA is not hemodynamically significant. Vertebral arteries: Left dominant. No evidence of dissection, occlusion, or hemodynamically significant stenosis (greater than 50%). Mild compression of the right vertebral artery by degenerative changes as it passes by the right neural foramen at C3-C4. Skeleton: No acute osseous abnormality. Degenerative changes in the cervical spine, with disc height loss, most prominent at C6-C7 and multilevel neural foraminal narrowing Other neck: No acute finding. Upper chest: Focal pulmonary opacity or pleural effusion. Air trapping. Review of the MIP images confirms the above findings CTA HEAD FINDINGS Anterior circulation: Both internal carotid  arteries are patent to the termini, with calcifications in the bilateral cavernous and supraclinoid portions that cause mild narrowing in the left supraclinoid ICA and moderate narrowing in the right supraclinoid ICA. A1 segments patent.

## 2021-11-28 NOTE — ED Notes (Signed)
Effie Shy MD notified regarding pts lactic 2.7 ?

## 2021-11-28 NOTE — ED Triage Notes (Addendum)
Per patient he did not fall but started having right hand pain that radiates up his arm and into his head.  Denies fall. Reports he was almost going to fall but didn't he laid down on his bed.  Complains of heaviness in the head.  ?

## 2021-11-28 NOTE — H&P (Signed)
?History and Physical  ? ? ?Patient: Paul Villarreal JFH:[id number] DOB: 1940-10-09 ?DOA: 11/28/2021 ?DOS: the patient was seen and examined on 11/28/2021 ?PCP: Garwin Brothers, MD  ?Patient coming from: Home ? ?Chief Complaint:  ?Chief Complaint  ?Patient presents with  ? Extremity Pain  ? ?HPI: Paul Villarreal is a 81 y.o. male with medical history significant of CAD s/p CABG, DM2, HLD. ? ?Pt had syncopal episode last night, unk duration of LOC.  His son found him with fecal soiling and at that time the patient was complaining of pain in his right hand.  Since then the patient has also complained of neck pain and headache. ? ?Second episode of decreased responsiveness while eating breakfast this AM.  Sudden loss of use of R hand.  Lasted a few mins.  In to ED around noon. ?  ?Review of Systems: As mentioned in the history of present illness. All other systems reviewed and are negative. ?Past Medical History:  ?Diagnosis Date  ? Anemia   ? Arthritis   ? Coronary artery disease   ? Decreased hearing   ? has hearing aids/Bil  ? Degenerative lumbar spinal stenosis   ? Diabetes mellitus without complication ([place])   ? GERD (gastroesophageal reflux disease)   ? Heart disease   ? Hyperlipemia   ? Iron deficiency   ? Lower extremity pain   ? Myocardial infarction [facility]) 2008  ? Bypass surg  ? Nonintractable chronic migraine   ? no per daughter  ? ?Past Surgical History:  ?Procedure Laterality Date  ? CARDIAC CATHETERIZATION    ? 2006  ? CORONARY ARTERY BYPASS GRAFT    ? 2006  ? LUMBAR LAMINECTOMY/DECOMPRESSION MICRODISCECTOMY Left 05/02/2015  ? Procedure: Bilateral Lumbar four five, Left  Lumbar five Sacral one Laminectomy;  Surgeon: Kristeen Miss, MD;  Location: [facility];  Service: Neurosurgery;  Laterality: Left;  Left L4-5 L5-S1 Laminectomy  ? ?Social History:  reports that he has never smoked. He has never used smokeless tobacco. He reports that he does not drink alcohol and does not use drugs. ? ?Allergies  ?Allergen  Reactions  ? Oxycodone Other (See Comments)  ?  Caused bowel obstruction ?Caused bowel obstruction ?Caused bowel obstruction ?Caused bowel obstruction ?Intestinal Obstruction  ?  ? Pork-Derived Products   ? ? ?Family History  ?Problem Relation Age of Onset  ? Stroke Father   ? Diabetes Sister   ? Hypertension Sister   ? Diabetes Brother   ? Cancer - Colon Neg Hx   ? Esophageal cancer Neg Hx   ? ? ?Prior to Admission medications   ?Medication Sig Start Date End Date Taking? Authorizing Provider  ?aspirin 81 MG tablet Take 81 mg by mouth daily.   Yes [provider]  ?Blood Glucose Monitoring Suppl KIT by Does not apply route daily.   Yes [provider]  ?Dexlansoprazole 30 MG capsule Take 1 capsule (30 mg total) by mouth daily before breakfast. ?Patient taking differently: Take 30 mg by mouth at bedtime. 03/26/18  Yes Rehman, Mechele Dawley, MD  ?gabapentin (NEURONTIN) 300 MG capsule Take 300 mg by mouth at bedtime.    Yes [provider]  ?Glucose Blood (ACCU-CHEK AVIVA PLUS VI) 1 strip by In Vitro route daily.   Yes [provider]  ?glucose blood test strip 1 each by Other route daily. Use as instructed   Yes [provider]  ?metFORMIN (GLUMETZA) 500 MG (MOD) 24 hr tablet Take 500 mg by mouth  2 (two) times daily with a meal.   Yes [provider]  ?metoprolol succinate (TOPROL-XL) 25 MG 24 hr tablet Take 25 mg by mouth daily. 09/29/21  Yes [provider]  ?Multiple Vitamin (MULTIVITAMIN WITH MINERALS) TABS tablet Take 1 tablet by mouth daily. Centrum Silver   Yes [provider]  ?nitroGLYCERIN (NITROSTAT) 0.4 MG SL tablet Place 0.4 mg under the tongue every 5 (five) minutes as needed for chest pain. 07/06/21  Yes [provider]  ?PRALUENT 75 MG/ML SOAJ Inject 1 mL into the skin every 14 (fourteen) days. 10/18/21  Yes [provider]  ?lansoprazole (PREVACID) 30 MG capsule Take 1 capsule (30 mg total) by mouth 2 (two) times daily  before a meal. ?Patient not taking: Reported on 11/28/2021 04/13/18   Rogene Houston, MD  ?pantoprazole (PROTONIX) 40 MG tablet Take 1 tablet (40 mg total) by mouth daily. ?Patient not taking: Reported on 11/28/2021 04/13/18   Rogene Houston, MD  ? ? ?Physical Exam: ?Vitals:  ? 11/28/21 1204 11/28/21 1726 11/28/21 2032 11/28/21 2130  ?BP: (!) 142/59 139/73 130/71 135/64  ?Pulse: 68 77 74 84  ?Resp: 16 18 18  (!) 26  ?Temp: 98.4 ?F (36.9 ?C) 98.3 ?F (36.8 ?C)    ?TempSrc: Oral Oral    ?SpO2: 96% 99% 99% 96%  ? ?Constitutional: NAD, calm, comfortable ?Eyes: PERRL, lids and conjunctivae normal ?ENMT: Mucous membranes are moist. Posterior pharynx clear of any exudate or lesions.Normal dentition.  ?Neck: normal, supple, no masses, no thyromegaly ?Respiratory: clear to auscultation bilaterally, no wheezing, no crackles. Normal respiratory effort. No accessory muscle use.  ?Cardiovascular: Regular rate and rhythm, no murmurs / rubs / gallops. No extremity edema. 2+ pedal pulses. No carotid bruits.  ?Abdomen: no tenderness, no masses palpated. No hepatosplenomegaly. Bowel sounds positive.  ?Musculoskeletal: R hand erythema of base of R thumb ?Skin: no rashes, lesions, ulcers. No induration ?Neurologic: CN 2-12 grossly intact. Sensation intact, DTR normal. Strength 5/5 in all 4.  ?Psychiatric: Normal judgment and insight. Alert and oriented x 3. Normal mood.  ? ?Data Reviewed: ? ?EKG showing T wave flattening in inferior leads, ? LVH, no STEMI ?Lactate: 2.0 and 2.7. ? ? ?  Latest Ref Rng & Units 11/28/2021  ? 12:45 PM 05/09/2015  ?  5:11 AM 05/08/2015  ?  4:57 AM  ?BMP  ?Glucose 70 - 99 mg/dL 207   107   179    ?BUN 8 - 23 mg/dL 24   16   17     ?Creatinine 0.61 - 1.24 mg/dL 1.01   0.98   0.95    ?Sodium 135 - 145 mmol/L 128   133   134    ?Potassium 3.5 - 5.1 mmol/L 4.3   4.1   4.0    ?Chloride 98 - 111 mmol/L 100   101   99    ?CO2 22 - 32 mmol/L 18   27   25     ?Calcium 8.9 - 10.3 mg/dL 9.0   8.3   9.5    ? ?CBC ?   ?Component  Value Date/Time  ? WBC 12.8 (H) 11/28/2021 1245  ? RBC 4.39 11/28/2021 1245  ? HGB 12.5 (L) 11/28/2021 1245  ? HCT 38.3 (L) 11/28/2021 1245  ? PLT 275 11/28/2021 1245  ? MCV 87.2 11/28/2021 1245  ? MCH 28.5 11/28/2021 1245  ? MCHC 32.6 11/28/2021 1245  ? RDW 13.5 11/28/2021 1245  ? LYMPHSABS 0.6 (L) 11/28/2021 1245  ?  MONOABS 0.7 11/28/2021 1245  ? EOSABS 0.0 11/28/2021 1245  ? BASOSABS 0.0 11/28/2021 1245  ? ? ? ? ?Assessment and Plan: ?* Syncope ?2 episodes of decreased responsiveness: 1 with syncope, 1 with loss of function of R arm. ?MRI brain neg for acute stroke or other finding ?CTA head = mild-mod dz, no LVO ?CTA neck = no significant dz ?Getting CTV head to r/o dural venous thrombosis ?Getting EEG ?Check TSH ?Syncope pathway ?Admitting for obs ?Tele monitor ?2d echo ?CXR ?Check UA ?Check trops ? ?Right arm pain ?With arthritis of wrist / thumb.  No bony injury on X ray today. ?Also neck pain and headache since syncope earlier today. ?Wrist splint ordered ?Check uric acid ?No fever, WBC to suggest septic arthritis at this point ?Will get MRI of R hand. ? ?Type 2 diabetes mellitus without complication ([place]) ?Hold metformin due to lactate of 2.7 ?Sensitive SSI AC/HS ? ? ? ? ? Advance Care Planning:   Code Status: Full Code ? ?Consults: EDP spoke with neuro ? ?Family Communication: Daughter at bedside ? ?Severity of Illness: ?The appropriate patient status for this patient is OBSERVATION. Observation status is judged to be reasonable and necessary in order to provide the required intensity of service to ensure the patient's safety. The patient's presenting symptoms, physical exam findings, and initial radiographic and laboratory data in the context of their medical condition is felt to place them at decreased risk for further clinical deterioration. Furthermore, it is anticipated that the patient will be medically stable for discharge from the hospital within 2 midnights of admission.  ? ?Author: ?Etta Quill., DO ?11/28/2021 10:24 PM ? ?For on call review [email or web address].  ?

## 2021-11-28 NOTE — ED Provider Triage Note (Signed)
Emergency Medicine Provider Triage Evaluation Note ? ?Paul Villarreal , a 81 y.o. male  was evaluated in triage.  Pt complains of pain to the back of his head and neck as well as swelling and pain to right hand.  Swelling and pain to right hand started 2 days ago and have gotten progressively worse.  Patient endorses redness developing over this time.  Patient is right-hand dominant.  Patient complains of pain to posterior occipital region of head radiating into his neck.  Denies any recent falls or injuries. ? ?Patient denies any fever, chills, numbness, weakness, visual disturbance, lightheadedness, syncope, fall, traumatic injury. ? ?Review of Systems  ?Positive: See above ?Negative: See above ? ?Physical Exam  ?BP (!) 142/59 (BP Location: Left Arm)   Pulse 68   Temp 98.4 ?F (36.9 ?C) (Oral)   Resp 16   SpO2 96%  ?Gen:   Awake, no distress   ?Resp:  Normal effort  ?MSK:   Moves extremities without difficulty  ?Other:  No midline tenderness or deformity to cervical spine.  Neck is supple with full range of motion. ? ?Swelling, erythema, and tenderness inferior to right thumb.  +2 radial pulse.  Cap refill less than 2 seconds in all digits of right hand.  Sensation intact to all digits of right hand. ? ?Medical Decision Making  ?Medically screening exam initiated at 12:12 PM.  Appropriate orders placed.  Paul Villarreal was informed that the remainder of the evaluation will be completed by another provider, this initial triage assessment does not replace that evaluation, and the importance of remaining in the ED until their evaluation is complete. ? ?Language was used to conduct this interview. ?  ?Haskel Schroeder, PA-C ?11/28/21 1213 ? ?

## 2021-11-29 ENCOUNTER — Observation Stay (HOSPITAL_COMMUNITY): Payer: Medicare Other

## 2021-11-29 ENCOUNTER — Observation Stay (HOSPITAL_BASED_OUTPATIENT_CLINIC_OR_DEPARTMENT_OTHER): Payer: Medicare Other

## 2021-11-29 DIAGNOSIS — R55 Syncope and collapse: Secondary | ICD-10-CM

## 2021-11-29 DIAGNOSIS — M79601 Pain in right arm: Secondary | ICD-10-CM | POA: Diagnosis not present

## 2021-11-29 DIAGNOSIS — E1142 Type 2 diabetes mellitus with diabetic polyneuropathy: Secondary | ICD-10-CM | POA: Diagnosis not present

## 2021-11-29 LAB — URINALYSIS, ROUTINE W REFLEX MICROSCOPIC
Bilirubin Urine: NEGATIVE
Glucose, UA: 50 mg/dL — AB
Hgb urine dipstick: NEGATIVE
Ketones, ur: NEGATIVE mg/dL
Leukocytes,Ua: NEGATIVE
Nitrite: NEGATIVE
Protein, ur: NEGATIVE mg/dL
Specific Gravity, Urine: 1.038 — ABNORMAL HIGH (ref 1.005–1.030)
pH: 5 (ref 5.0–8.0)

## 2021-11-29 LAB — GLUCOSE, CAPILLARY: Glucose-Capillary: 195 mg/dL — ABNORMAL HIGH (ref 70–99)

## 2021-11-29 LAB — ECHOCARDIOGRAM COMPLETE
AR max vel: 1.27 cm2
AV Area VTI: 1.18 cm2
AV Area mean vel: 1.17 cm2
AV Mean grad: 18.5 mmHg
AV Peak grad: 31.2 mmHg
Ao pk vel: 2.8 m/s
Area-P 1/2: 2.25 cm2
P 1/2 time: 381 msec
S' Lateral: 3.1 cm

## 2021-11-29 LAB — CBG MONITORING, ED
Glucose-Capillary: 218 mg/dL — ABNORMAL HIGH (ref 70–99)
Glucose-Capillary: 141 mg/dL — ABNORMAL HIGH (ref 70–99)
Glucose-Capillary: 209 mg/dL — ABNORMAL HIGH (ref 70–99)
Glucose-Capillary: 134 mg/dL — ABNORMAL HIGH (ref 70–99)

## 2021-11-29 LAB — CBC
HCT: 34.9 % — ABNORMAL LOW (ref 39.0–52.0)
Hemoglobin: 11.9 g/dL — ABNORMAL LOW (ref 13.0–17.0)
MCH: 29.2 pg (ref 26.0–34.0)
MCHC: 34.1 g/dL (ref 30.0–36.0)
MCV: 85.5 fL (ref 80.0–100.0)
Platelets: 291 10*3/uL (ref 150–400)
RBC: 4.08 MIL/uL — ABNORMAL LOW (ref 4.22–5.81)
RDW: 13.6 % (ref 11.5–15.5)
WBC: 11.2 10*3/uL — ABNORMAL HIGH (ref 4.0–10.5)
nRBC: 0 % (ref 0.0–0.2)

## 2021-11-29 LAB — COMPREHENSIVE METABOLIC PANEL
ALT: 18 U/L (ref 0–44)
AST: 21 U/L (ref 15–41)
Albumin: 3.2 g/dL — ABNORMAL LOW (ref 3.5–5.0)
Alkaline Phosphatase: 61 U/L (ref 38–126)
Anion gap: 7 (ref 5–15)
BUN: 27 mg/dL — ABNORMAL HIGH (ref 8–23)
CO2: 22 mmol/L (ref 22–32)
Calcium: 9 mg/dL (ref 8.9–10.3)
Chloride: 103 mmol/L (ref 98–111)
Creatinine, Ser: 1.24 mg/dL (ref 0.61–1.24)
GFR, Estimated: 58 mL/min — ABNORMAL LOW (ref >60)
Glucose, Bld: 186 mg/dL — ABNORMAL HIGH (ref 70–99)
Potassium: 4.3 mmol/L (ref 3.5–5.1)
Sodium: 132 mmol/L — ABNORMAL LOW (ref 135–145)
Total Bilirubin: 0.6 mg/dL (ref 0.3–1.2)
Total Protein: 7.3 g/dL (ref 6.5–8.1)

## 2021-11-29 LAB — TROPONIN I (HIGH SENSITIVITY)
Troponin I (High Sensitivity): 20 ng/L — ABNORMAL HIGH (ref <18)
Troponin I (High Sensitivity): 22 ng/L — ABNORMAL HIGH (ref <18)
Troponin I (High Sensitivity): 20 ng/L — ABNORMAL HIGH (ref <18)

## 2021-11-29 LAB — LACTIC ACID, PLASMA
Lactic Acid, Venous: 2.2 mmol/L (ref 0.5–1.9)
Lactic Acid, Venous: 2.5 mmol/L (ref 0.5–1.9)

## 2021-11-29 IMAGING — MR MR [PERSON_NAME]*[PERSON_NAME]* W/O CM
4 of 5 series · 18 of 40 positions shown · non-contrast
Comparison: Right hand radiographs [DATE]

CLINICAL DATA: Hand pain. Stress fracture suspected. Negative
x-ray. Erythema and swelling. Syncopal episode last night unknown
duration of loss of consciousness.

EXAM:
MRI OF THE RIGHT HAND WITHOUT CONTRAST
TECHNIQUE: Multiplanar, multisequence MR imaging of the right hand was
performed. No intravenous contrast was administered.

[Series 6: T1 · axial · 4.0mm · 0.23mm/px · z∈[-131,+7]mm · 3 of 44 slices shown (1 of 2)]
[im 8/44]
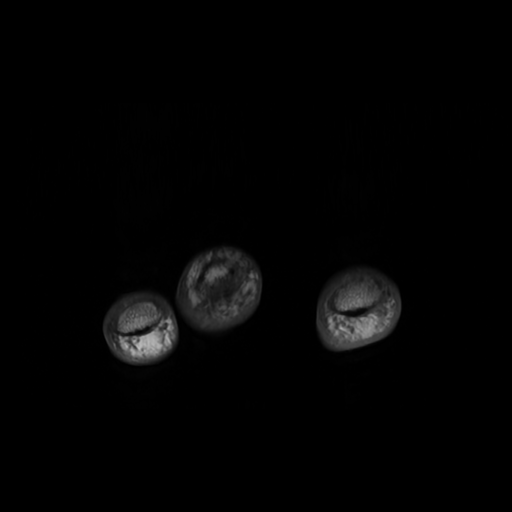
[im 24/44]
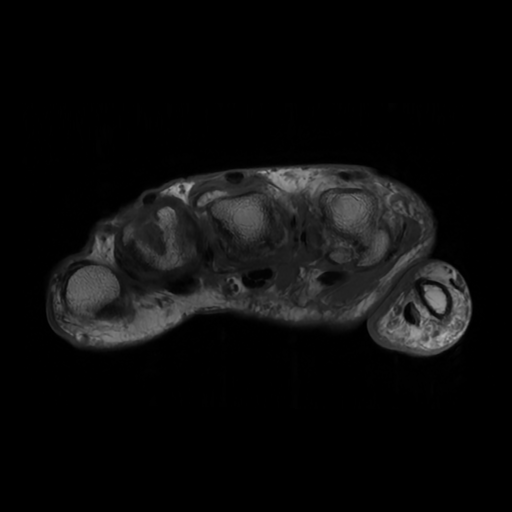
[im 40/44]
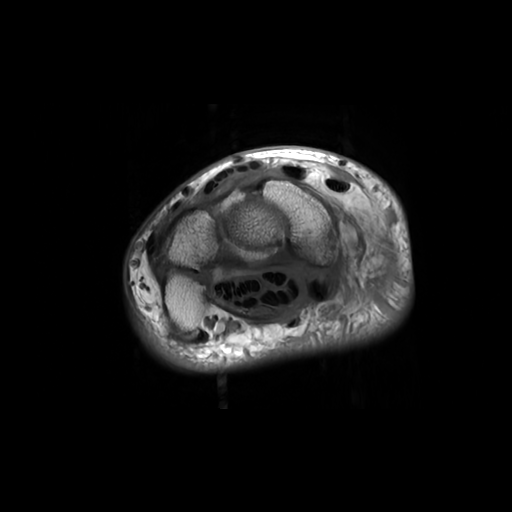

[Series 7: T2 fat-sat · axial · 4.0mm · 0.23mm/px · z∈[-144,+3]mm · 3 of 44 slices shown]
[im 5/44]
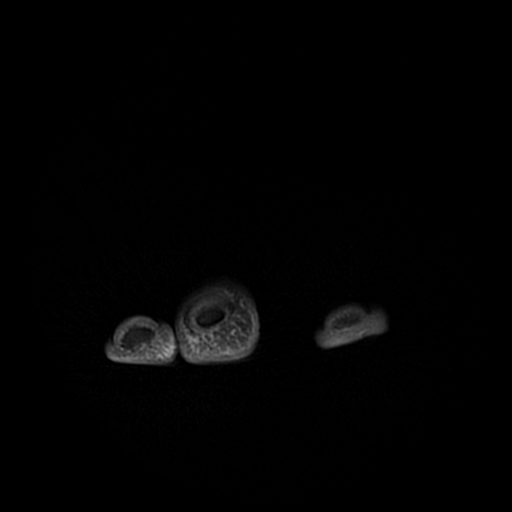
[im 22/44]
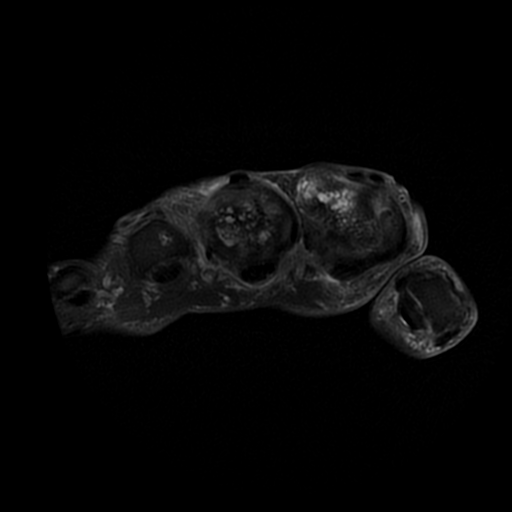
[im 39/44]
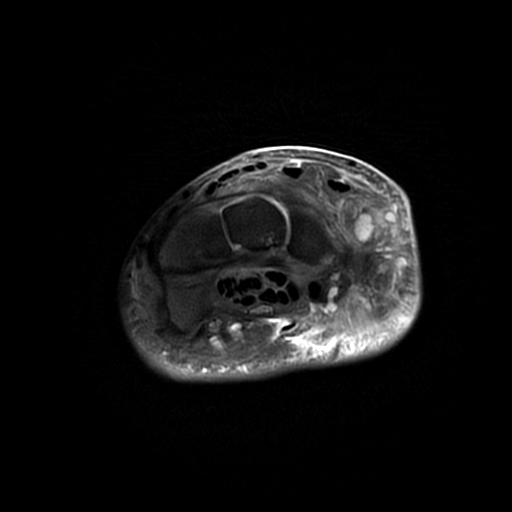

[Series 10: PD fat-sat · sagittal · 3.0mm · 0.39mm/px · 9 of 33 slices shown]
[im 1/33]
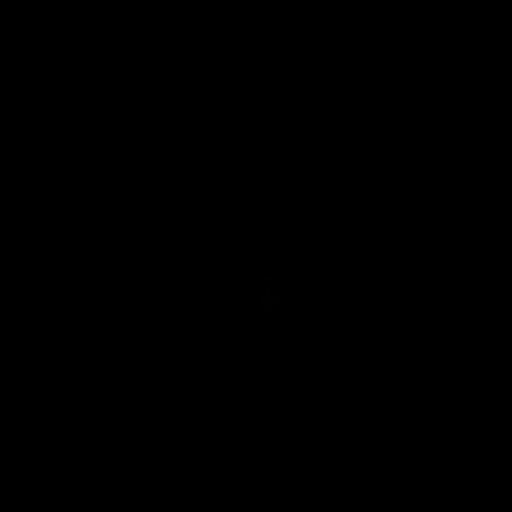
[im 5/33]
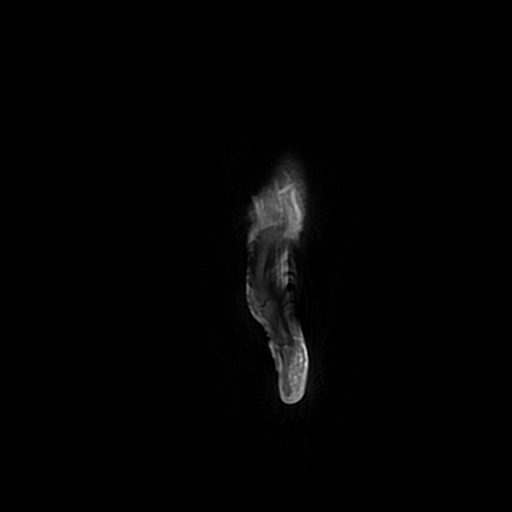
[im 9/33]
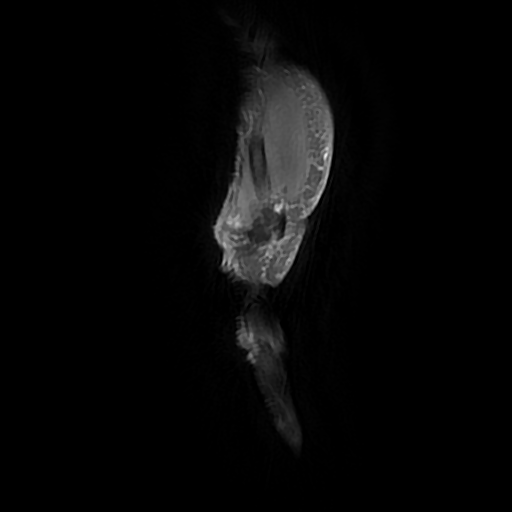
[im 13/33]
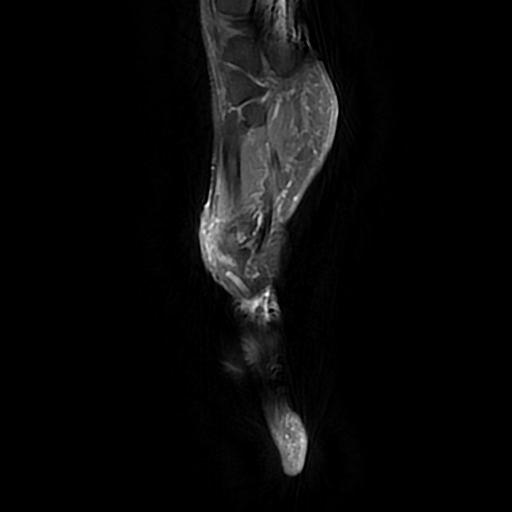
[im 17/33]
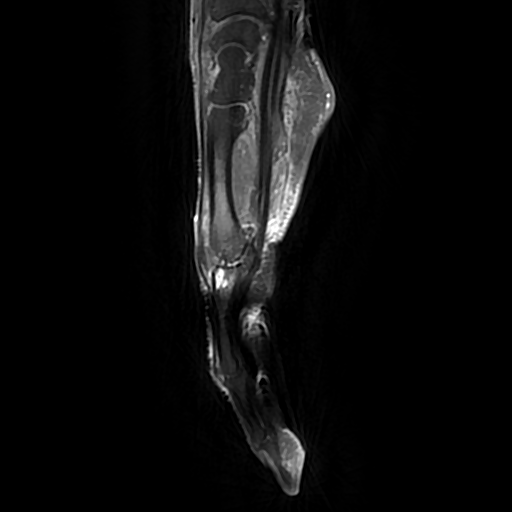
[im 21/33]
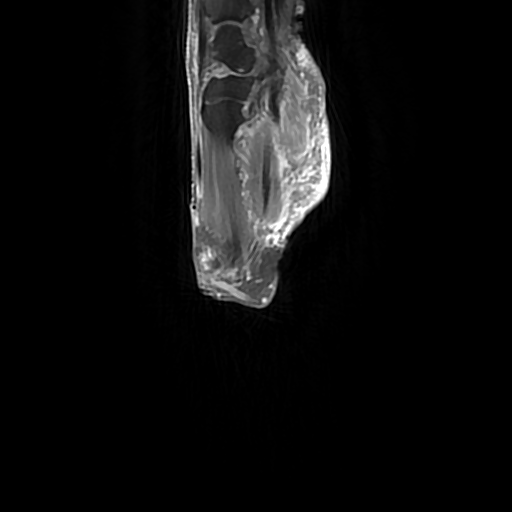
[im 25/33]
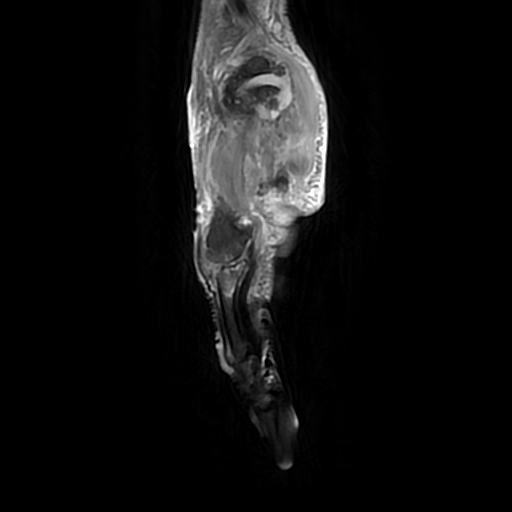
[im 29/33]
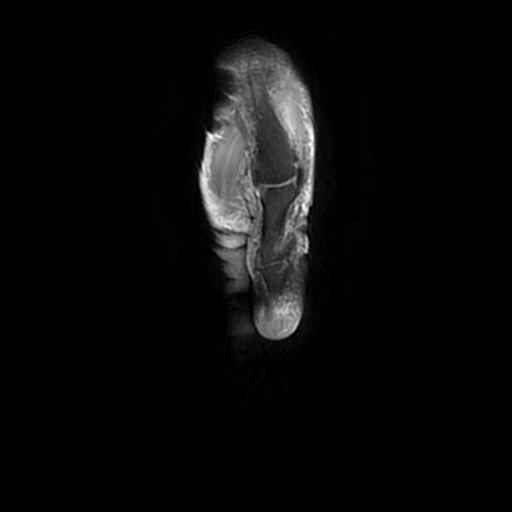
[im 33/33]
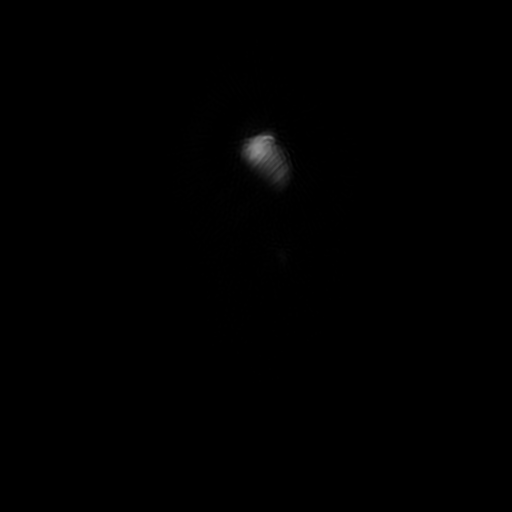

[Series 11: T1 · coronal · 3.0mm · 0.39mm/px · 3 of 17 slices shown (2 of 2)]
[im 1/17]
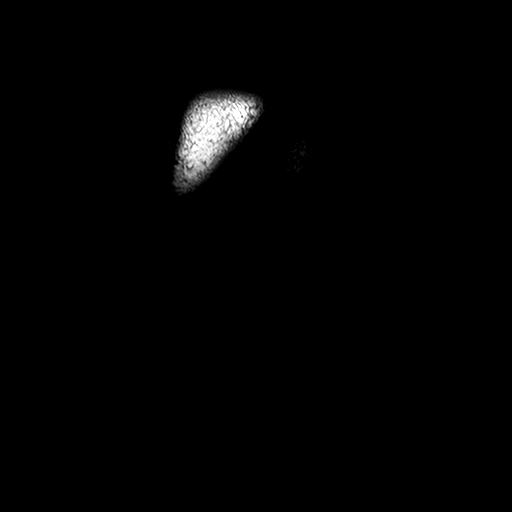
[im 11/17]
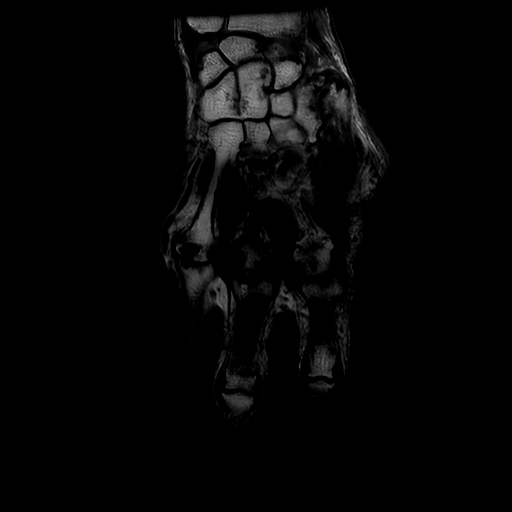
[im 17/17]
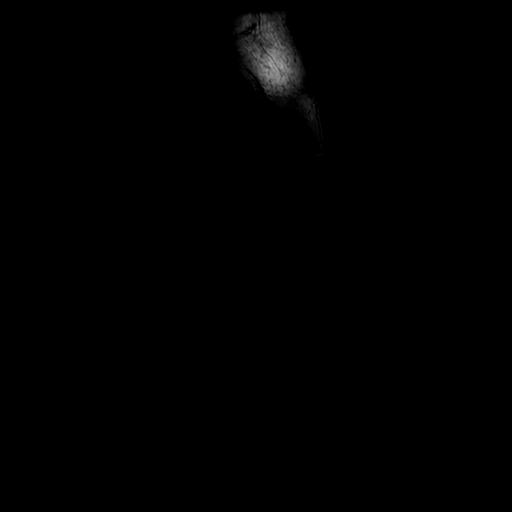

[18 of 40 positions shown; findings below may reference images not displayed]

FINDINGS: Bones/Joint/Cartilage

There are degenerative changes including cartilage thinning, joint
space narrowing, subchondral cystic change, and peripheral
osteophytosis that are markedly severe at the thumb carpometacarpal
joint, severe at the third greater than fourth and second
metacarpophalangeal joints, severe at the triscaphe joint, and
moderate throughout the thumb interphalangeal and the second through
fifth digit DIP joints, as seen on yesterday's radiographs.

Moderate radiocarpal joint space narrowing.

No acute fracture is seen.

Ligaments

No ligament tear is seen.

Muscles and Tendons

There is mild edema within the palmar thenar muscles, including the
abductor pollicis brevis, flexor pollicis brevis and opponens
pollicis muscles (axial series 7 images 6 through 15). The flexor
tendons and flexor retinaculum appear intact. There is intermediate
T2 signal seen within the abductor pollicis longus and extensor
pollicis brevis tendons at the level of the carpals, likely chronic
tendinosis (axial series 7 images 4 through 6).

Soft tissues

There is mild-to-moderate edema and swelling of the soft tissues
palmar and radial to the thumb and the dorsal and radial aspects of
the index finger. No walled-off fluid collection is seen.

There is a decreased T1 increased T2 signal multiloculated likely
degenerative ganglion extending dorsally from the lateral aspect of
the trapezium (coronal series 12 images 4 through 6, axial series 7
images 5 and 6), likely a degenerative ganglion extending that
appears to originate from the first carpometacarpal joint.
Alternatively this may originate from the triscaphe joint. This
measures up to approximately 11 x 12 x 12 mm (transverse by AP by
craniocaudal, axial image 6 and coronal image 5).
IMPRESSION: :
IMPRESSION: 1. No acute fracture is seen.
2. Osteoarthritis greatest within the thumb carpometacarpal third
greater than fourth and second metacarpophalangeal, and triscaphe
joints.
3. Mild edema within the palmar thenar muscles, possible muscle
strain versus nonspecific myositis. There is mild subcutaneous fat
edema and swelling of the subcutaneous fat at the thenar aspect of
the hand and also dorsal lateral aspect of the proximal index
finger. This is nonspecific. No walled-off fluid collection is seen.
4. Likely chronic tendinosis of the abductor pollicis longus and
extensor pollicis brevis tendons of the wrist first dorsal extensor
tendon compartment.

## 2021-11-29 MED ORDER — ADULT MULTIVITAMIN W/MINERALS CH: 1.0000 | ORAL_TABLET | Freq: Every day | ORAL | Status: DC

## 2021-11-29 MED ORDER — CEFAZOLIN SODIUM-DEXTROSE 1-4 GM/50ML-% IV SOLN
1.0000 g | Freq: Three times a day (TID) | INTRAVENOUS | Status: DC
Start: 2021-11-29 — End: 2021-11-30
  Administered 2021-11-29 – 2021-11-30 (×3): 1 g via INTRAVENOUS
  Filled 2021-11-29 (×5): qty 50

## 2021-11-29 MED ORDER — ASPIRIN 81 MG PO CHEW
81.0000 mg | CHEWABLE_TABLET | Freq: Every day | ORAL | Status: DC
  Administered 2021-11-29 – 2021-11-30 (×2): 81 mg via ORAL
  Filled 2021-11-29 (×2): qty 1

## 2021-11-29 MED ORDER — PANTOPRAZOLE SODIUM 40 MG PO TBEC
40.0000 mg | DELAYED_RELEASE_TABLET | Freq: Every day | ORAL | Status: DC
  Administered 2021-11-29: 40 mg via ORAL
  Filled 2021-11-29: qty 1

## 2021-11-29 MED ORDER — NAPROXEN 250 MG PO TABS
250.0000 mg | ORAL_TABLET | Freq: Two times a day (BID) | ORAL | Status: DC
  Administered 2021-11-29 – 2021-11-30 (×2): 250 mg via ORAL
  Filled 2021-11-29 (×3): qty 1

## 2021-11-29 MED ORDER — GABAPENTIN 300 MG PO CAPS
300.0000 mg | ORAL_CAPSULE | Freq: Every day | ORAL | Status: DC
  Administered 2021-11-29: 300 mg via ORAL
  Filled 2021-11-29: qty 1

## 2021-11-29 MED ORDER — METOPROLOL SUCCINATE ER 25 MG PO TB24
25.0000 mg | ORAL_TABLET | Freq: Every day | ORAL | Status: DC
  Administered 2021-11-29 – 2021-11-30 (×2): 25 mg via ORAL
  Filled 2021-11-29 (×2): qty 1

## 2021-11-29 MED ORDER — SODIUM CHLORIDE 0.9 % IV SOLN: INTRAVENOUS | Status: DC

## 2021-11-29 NOTE — Progress Notes (Signed)
Orthopedic Tech Progress Note ?Patient Details:  ?Paul Villarreal ?[birth year] ?[id number] ? ?Ortho Devices ?Type of Ortho Device: Thumb velcro splint ?Ortho Device/Splint Location: RUE ?Ortho Device/Splint Interventions: Application, Ordered, Adjustment ?  ?Post Interventions ?Patient Tolerated: Well ? ?[name] ?11/29/2021, 2:38 PM ? ?

## 2021-11-29 NOTE — Consult Note (Addendum)
NEUROLOGY CONSULTATION NOTE  ? ?Date of service: November 29, 2021 ?Patient Name: Paul Villarreal ?MRN:  [id number] ?DOB:  1940-11-24 ?Reason for consult: "Encephalopathy and LOC" ?Requesting Provider: Leatha Gilding, MD ? ?History of Present Illness  ?Paul Villarreal is a 81 y.o. male with PMH DM2, HLD, CAD s/p bypass, migraines who presented to [facility] (11/28/2021) via family for transient encephalopathy and right hand pain. ? ?No family at bedside. Assistance with remote translator, phone conversation with daughter and SIL ? ?He had onset of hand pain in the evening prior to his LOC. ? ?11/28/2021 AM, patient woke up feeling normal around 7am aside from hand pain. While eating an apple, he had sudden onset tired, dizziness, nausea without vomiting so he laid on the floor briefly, but then moved to bed where he had LOC for unknown amount of time and woke up in his feces. He then cleaned up, showered, then went downstairs for breakfast with SIL. During breakfast and conversation around 10:30am, patient had sudden onset of slurred and difficulty speaking with eyes closed, confusion, random right hand movement, not answering questions. This episode lasted about 10-15 mins, which patient has no recollection of.  ? ?Denied HA, CP, SOB, vision changes during these episodes.  ? ?Home CBG during this time was 180s, HR 60s. ? ?About 13 years ago, he was found slumped over kitchen counter but no definite loss of consciousness. About 3 years ago, after family death, he had a fall in the shower without LOC.  ? ?Also patient's main concern was right thumb pain that progressed up his arm and back of his head that occurred suddenly during sleep that woke him up. Pain remained constant until the morning. ? ?Per daughter, had 2 falls since December, once in [place] and in [place]. No LOC. Reported reason as "miss-step" for the first, and missing a seat while sitting down for a second. ? ?Already on asa 81mg  for cardiac, no statin  seen in dispense records. Was on plavix for cardiac. ? ?ROS  ? ?Constitutional Denies weight loss, fever and chills.   ?HEENT Denies changes in vision and hearing.   ?Respiratory Denies SOB and cough.   ?CV Denies palpitations and CP   ?GI Denies abdominal pain, vomiting and diarrhea. Nausea  ?GU Denies dysuria. Had bowel incontinence  ?MSK Right thumb pain  ?Neurological Headache and syncope.   ? ?Past History  ? ?Past Medical History:  ?Diagnosis Date  ?? Anemia   ?? Arthritis   ?? Coronary artery disease   ?? Decreased hearing   ? has hearing aids/Bil  ?? Degenerative lumbar spinal stenosis   ?? Diabetes mellitus without complication ([facility])   ?? GERD (gastroesophageal reflux disease)   ?? Heart disease   ?? Hyperlipemia   ?? Iron deficiency   ?? Lower extremity pain   ?? Myocardial infarction [facility]) 2008  ? Bypass surg  ?? Nonintractable chronic migraine   ? no per daughter  ? ?Past Surgical History:  ?Procedure Laterality Date  ?? CARDIAC CATHETERIZATION    ? 2006  ?? CORONARY ARTERY BYPASS GRAFT    ? 2006  ?? LUMBAR LAMINECTOMY/DECOMPRESSION MICRODISCECTOMY Left 05/02/2015  ? Procedure: Bilateral Lumbar four five, Left  Lumbar five Sacral one Laminectomy;  Surgeon: 05/04/2015, MD;  Location: [facility];  Service: Neurosurgery;  Laterality: Left;  Left L4-5 L5-S1 Laminectomy  ? ?Family History  ?Problem Relation Age of Onset  ?? Stroke Father   ?? Diabetes Sister   ??  Hypertension Sister   ?? Diabetes Brother   ?? Cancer - Colon Neg Hx   ?? Esophageal cancer Neg Hx   ? ?Social History  ? ?Socioeconomic History  ?? Marital status: Married  ?  Spouse name: Not on file  ?? Number of children: 3  ?? Years of education: Not on file  ?? Highest education level: Not on file  ?Occupational History  ?? Occupation: retired  ?Tobacco Use  ?? Smoking status: Never  ?? Smokeless tobacco: Never  ?Substance and Sexual Activity  ?? Alcohol use: No  ?? Drug use: No  ?? Sexual activity: Not on file  ?Other Topics Concern  ?? Not  on file  ?Social History Narrative  ? Lives with daughter and wife  ? 2 cups of caffeine a day   ? ?Social Determinants of Health  ? ?Financial Resource Strain: Not on file  ?Food Insecurity: Not on file  ?Transportation Needs: Not on file  ?Physical Activity: Not on file  ?Stress: Not on file  ?Social Connections: Not on file  ? ?Allergies  ?Allergen Reactions  ?? Oxycodone Other (See Comments)  ?  Caused bowel obstruction ?Caused bowel obstruction ?Caused bowel obstruction ?Caused bowel obstruction ?Intestinal Obstruction  ?  ?? Pork-Derived Products   ? ? ?Medications  ?(Not in a hospital admission) ?  ? ?Vitals  ? ?Vitals:  ? 11/29/21 0915 11/29/21 1028 11/29/21 1115 11/29/21 1245  ?BP: 136/76 131/73 (!) 145/72 111/64  ?Pulse: 83 76 77 73  ?Resp: 14 17 (!) 25 (!) 22  ?Temp:  98.5 ?F (36.9 ?C)    ?TempSrc:  Oral    ?SpO2: 98% 98% 99% 98%  ?  ? ?There is no height or weight on file to calculate BMI. ? ?Physical Exam  ?General: Laying comfortably in bed; in no acute distress.  ?Pulmonary: Symmetric chest rise. Non-labored respiratory effort.  ?Ext: No cyanosis, edema. Right thenar redness and tenderness ? ?Neurologic Examination  ?Mental status/Cognition:  ?Alert, attentive, engaged ?Oriented to self, place, month and year ?Speech/language:  ?Able to give a clear and coherent history, no dysarthria ?Comprehension intact-able to follow 3 step commands without difficulty.  ?No neglect ? ?Cranial Nerves: ?II: No VF deficits.  ?III,IV, VI: PERRL. No gaze preference or deviation. Ptosis not present, EOMI bilaterally. No nystagmus  ?V: Facial light touch sensation normal bilaterally ?VII: Face symmetric at rest and smiling. No nasolabial fold flattening  ?VIII: Hearing normal bilaterally ?IX,X: Cough intact.  ?XI: Bilateral shoulder shrug and head turn ?XII: Midline tongue extension ?Motor: ?R  UE 5/5 LE 5/5  ?L UE 5/5 LE 5/5  ?Normal tone throughout.  ?Normal bulk throughout.  ?No atrophy noted ? ?Sensory: Pinprick and  light touch intact throughout bilaterally ? ?Coordination/Complex Motor:  ?- Finger to Nose intact ?- Rapid alternating movement intact ?- Gait: deferred ? ?Labs  ? ?CBC:  ?Recent Labs  ?Lab 11/28/21 ?1245 11/29/21 ?0543  ?WBC 12.8* 11.2*  ?NEUTROABS 11.3*  --   ?HGB 12.5* 11.9*  ?HCT 38.3* 34.9*  ?MCV 87.2 85.5  ?PLT 275 291  ? ? ?Basic Metabolic Panel:  ?Lab Results  ?Component Value Date  ? NA 132 (L) 11/29/2021  ? K 4.3 11/29/2021  ? CO2 22 11/29/2021  ? GLUCOSE 186 (H) 11/29/2021  ? BUN 27 (H) 11/29/2021  ? CREATININE 1.24 11/29/2021  ? CALCIUM 9.0 11/29/2021  ? GFRNONAA 58 (L) 11/29/2021  ? GFRAA >60 05/09/2015  ? ?Lipid Panel: No results found for: LDLCALC ?HgbA1c:  ?  Lab Results  ?Component Value Date  ? HGBA1C 6.1 (H) 05/08/2015  ? ?Urine Drug Screen: No results found for: LABOPIA, COCAINSCRNUR, LABBENZ, AMPHETMU, THCU, LABBARB  ?Alcohol Level No results found for: ETH ? ?CT Head wo con ?1. No acute intracranial abnormality. ?2. No evidence of cervical spine fracture or traumatic malalignment. ?3. Moderate multilevel degenerative disc disease. ? ?CTA Head and Neck ?1. No intracranial large vessel occlusion. Moderate narrowing in the ?right supraclinoid ICA and right PCA, with mild narrowing in the ?left supraclinoid ICA and right V4. ?2.  No hemodynamically significant stenosis in the neck. ? ?MRI Brain wo con ?No acute intracranial process. No evidence of acute or subacute ?infarct. ? ?rEEG: ?This study is within normal limits. No seizures or epileptiform discharges were seen throughout the recording. ? ?CT Venogram: ?No evidence of dural venous sinus thrombosis or stenosis. ? ?2D Echo: ?EF 55 to 60%. G2DD.  ? ?Impression  ?Paul Villarreal is a 81 y.o. male with PMH DM2, HLD, CAD s/p bypass, migraines who presented to [facility] (11/28/2021) via family for transient encephalopathy and right hand pain. CTH, MRI, CTA unremarkable. Echo unremarkable. EEG wnl.  ? ?VV syncope vs TIA vs cardiac syncope and less  likely partial seizure.  ?Unclear, likely multifactorial. Recommend completing TIA/stroke work up with a1c and lipid panel for risk factor modifications. No DAPT, rather continue home baby asa given memor

## 2021-11-29 NOTE — Progress Notes (Signed)
?PROGRESS NOTE ? ?Virgel Gess ZA:[id number] DOB: 10/17/1940 DOA: 11/28/2021 ?PCP: Garwin Brothers, MD ? ? LOS: 0 days  ? ?Brief Narrative / Interim history: ?This is an 81 year old male with history of coronary artery disease status post CABG, DM 2, hyperlipidemia, comes to the hospital with syncopal episode and a second episode of decreased responsiveness.  The night prior to admission, patient experienced sudden onset of right wrist pain radiating to his right elbow, shoulder, head and next thing he knows he is down with family around him, and soiled himself.  Son-in-law found him.  He has pain in the right hand persisted afterwards.  The next morning, while eating breakfast, he had a second episode of decreased responsiveness.  No apparent full loss of consciousness.  Family noted that he was not able to use his right hand when this happened.  Patient denies any chest pain, denies any palpitations.  He denies any fever or chills.  He denies any trauma or injury to the right wrist or similar episodes in the past. ? ?Subjective / 24h Interval events: ?He is doing well this morning, complains of right wrist still hurting.  No abdominal pain, no shortness of breath, no nausea or vomiting. ? ?Assesement and Plan: ?Principal Problem: ?  Syncope ?Active Problems: ?  Right arm pain ?  Type 2 diabetes mellitus without complication (HCC) ?  Diabetic neuropathy ([place]) ? ? ?Principal problem ?Syncope-2 episodes of decreased responsiveness, 1 with syncope, 1 with loss of function of the right arm.  Glucose was checked at home and he was not hypoglycemic.  Patient underwent an MRI of the brain which was negative for acute strokes.  CT angiogram was without large vessel occlusions.  CT venous was negative for dural venous thrombosis.  EEG is unremarkable and did not show any epileptiform discharges.  2D echo is unremarkable.  Neurology consulted given recurrent episodes without a clear etiology, discussed with Dr. Leonel Ramsay,  appreciate input ? ?Active problems ?Right wrist swelling-at the base of the right thumb.  Etiology is not entirely clear, MRI showed mild edema within the palmar thenar muscles, possible muscle strain versus nonspecific myositis.  With elevated WBC and elevated lactic acid cannot rule out cellulitis, patient was started on antibiotics.  Hand surgery saw patient, recommending NSAIDs versus steroids also due to concern for OA flare, started naproxen with meals.  He is also on PPI.  Would prefer to stay away from steroids until it is more clear as to what he has.  They will reevaluate wrist in the morning, and asking patient to be NPO. ? ?Coronary artery disease, history of CABG-no chest pain.  Monitor on telemetry.  Troponins flat, not in a pattern consistent with ACS.  To the echo shows normal EF 0000000, grade 2 diastolic dysfunction.  He has mild mitral valve regurgitation and stenosis and moderate to severe mitral annular calcification.  There is moderate calcification of the aortic valve.  PA pressure was normal RV was normal.  Continue aspirin, metoprolol ? ?Essential hypertension-continue metoprolol ? ?Leukocytosis-monitor, slightly better today ? ?Elevated BUN-possibly dehydration.  Fluids overnight ? ?Hyponatremia-mild, possibly dehydrated.  128 on admission, 132 this morning.  Repeat tomorrow morning. ? ?Type 2 diabetes mellitus-no hypoglycemic events as above.  He has been placed on sliding scale, continue, monitor CBGs ? ?CBG (last 3)  ?Recent Labs  ?  11/29/21 ?0234 11/29/21 ?0907 11/29/21 ?1149  ?GLUCAP 218* 141* 209*  ? ? ?Scheduled Meds: ? aspirin  81 mg Oral Daily  ?  enoxaparin (LOVENOX) injection  40 mg Subcutaneous Q24H  ? gabapentin  300 mg Oral QHS  ? insulin aspart  0-5 Units Subcutaneous QHS  ? insulin aspart  0-9 Units Subcutaneous TID WC  ? metoprolol succinate  25 mg Oral Daily  ? multivitamin with minerals  1 tablet Oral Daily  ? naproxen  250 mg Oral BID WC  ? pantoprazole  40 mg Oral QHS   ? sodium chloride flush  3 mL Intravenous Q12H  ? ?Continuous Infusions: ? sodium chloride    ? sodium chloride 75 mL/hr at 11/29/21 1301  ?  ceFAZolin (ANCEF) IV    ? ?PRN Meds:.ketorolac ? ?Diet Orders (From admission, onward)  ? ?  Start     Ordered  ? 11/30/21 0001  Diet NPO time specified  Diet effective midnight       ? 11/29/21 1325  ? 11/28/21 2203  Diet Carb Modified Fluid consistency: Thin; Room service appropriate? Yes  Diet effective now       ?Question Answer Comment  ?Diet-HS Snack? Nothing   ?Calorie Level Medium 1600-2000   ?Fluid consistency: Thin   ?Room service appropriate? Yes   ?  ? 11/28/21 2202  ? ?  ?  ? ?  ? ? ?DVT prophylaxis: enoxaparin (LOVENOX) injection 40 mg Start: 11/28/21 2215 ? ? ?Lab Results  ?Component Value Date  ? PLT 291 11/29/2021  ? ? ?  Code Status: Full Code ? ?Family Communication: Son over the phone (he is an Administrator, Civil Service), daughter at bedside and over the phone (she is a Teacher, music). ? ?Status is: Observation ? ?The patient will require care spanning > 2 midnights and should be moved to inpatient because: Persistent hand swelling, IV antibiotics, IV fluids ? ? ?Level of care: Telemetry Medical ? ?Consultants:  ?Neurology ?Hand surgery ? ?Procedures:  ?2D echo ? ?Microbiology  ?none ? ?Antimicrobials: ?Cefazolin 4/27 >>  ? ? ?Objective: ?Vitals:  ? 11/29/21 0915 11/29/21 1028 11/29/21 1115 11/29/21 1245  ?BP: 136/76 131/73 (!) 145/72 111/64  ?Pulse: 83 76 77 73  ?Resp: 14 17 (!) 25 (!) 22  ?Temp:  98.5 ?F (36.9 ?C)    ?TempSrc:  Oral    ?SpO2: 98% 98% 99% 98%  ? ?No intake or output data in the 24 hours ending 11/29/21 1344 ?Wt Readings from Last 3 Encounters:  ?03/26/18 73.4 kg  ?03/13/17 69.4 kg  ?03/06/17 70.1 kg  ? ? ?Examination: ? ?Constitutional: NAD ?Eyes: no scleral icterus ?ENMT: Mucous membranes are moist.  ?Neck: normal, supple ?Respiratory: clear to auscultation bilaterally, no wheezing, no crackles. Normal respiratory effort. No accessory muscle use.   ?Cardiovascular: Regular rate and rhythm, no murmurs / rubs / gallops. No LE edema.  ?Abdomen: non distended, no tenderness. Bowel sounds positive.  ?Musculoskeletal: no clubbing / cyanosis.  ?Skin: Swelling and erythema at the base of the right thumb, tender to palpation ?Neurologic: Nonfocal ? ? ?Data Reviewed: I have independently reviewed following labs and imaging studies ? ?CBC ?Recent Labs  ?Lab 11/28/21 ?1245 11/29/21 ?0543  ?WBC 12.8* 11.2*  ?HGB 12.5* 11.9*  ?HCT 38.3* 34.9*  ?PLT 275 291  ?MCV 87.2 85.5  ?MCH 28.5 29.2  ?MCHC 32.6 34.1  ?RDW 13.5 13.6  ?LYMPHSABS 0.6*  --   ?MONOABS 0.7  --   ?EOSABS 0.0  --   ?BASOSABS 0.0  --   ? ? ?Recent Labs  ?Lab 11/28/21 ?1245 11/28/21 ?1250 11/28/21 ?1753 11/28/21 ?2200 11/29/21 ?[id number] 11/29/21 ?[id number] 11/29/21 ?1044  ?  NA 128*  --   --   --  132*  --   --   ?K 4.3  --   --   --  4.3  --   --   ?CL 100  --   --   --  103  --   --   ?CO2 18*  --   --   --  22  --   --   ?GLUCOSE 207*  --   --   --  186*  --   --   ?BUN 24*  --   --   --  27*  --   --   ?CREATININE 1.01  --   --   --  1.24  --   --   ?CALCIUM 9.0  --   --   --  9.0  --   --   ?AST 24  --   --   --  21  --   --   ?ALT 17  --   --   --  18  --   --   ?ALKPHOS 73  --   --   --  61  --   --   ?BILITOT 0.9  --   --   --  0.6  --   --   ?ALBUMIN 3.7  --   --   --  3.2*  --   --   ?LATICACIDVEN  --  2.0* 2.7*  --   --  2.2* 2.5*  ?TSH  --   --   --  0.981  --   --   --   ? ? ?------------------------------------------------------------------------------------------------------------------ ?No results for input(s): CHOL, HDL, LDLCALC, TRIG, CHOLHDL, LDLDIRECT in the last 72 hours. ? ?Lab Results  ?Component Value Date  ? HGBA1C 6.1 (H) 05/08/2015  ? ?------------------------------------------------------------------------------------------------------------------ ?Recent Labs  ?  11/28/21 ?2200  ?TSH 0.981  ? ? ?Cardiac Enzymes ?No results for input(s): CKMB, TROPONINI, MYOGLOBIN in the last 168  hours. ? ?Invalid input(s): CK ?------------------------------------------------------------------------------------------------------------------ ?No results found for: BNP ? ?CBG: ?Recent Labs  ?Lab 11/29/21 ?0234 04/27/2

## 2021-11-29 NOTE — ED Notes (Signed)
Neurologist at bedside at this time to assess patient Patient reports via interpreter that he has no complaints at this time and needs no additional comfort measures. Call bell in reach and monitor is on and cycling ?

## 2021-11-29 NOTE — Progress Notes (Signed)
Echocardiogram ?2D Echocardiogram has been performed. ? ?Arlyss Gandy ?11/29/2021, 2:11 PM ?

## 2021-11-29 NOTE — Consult Note (Signed)
Reason for Consult:Right hand pain ?Referring Physician: Pamella Villarreal ?Time called: 1215 ?Time at bedside: 1220 ? ? ?Paul Villarreal is an 82 y.o. male.  ?HPI: Paul Villarreal suffered severe right hand pain that began 2 nights ago. It occurred out of the blue and was a 9 or 10/10. He felt it most at the base of his thumb but it radiated to the hand and up the arm to his brain. He also suffered a syncopal event. He was brought to the ED where MRI showed some nonspecific but abnormal findings and hand surgery was consulted. He says the pain has lessened to about 3/10 now. He denies prior hx/o similar and is RHD. ? ?Past Medical History:  ?Diagnosis Date  ? Anemia   ? Arthritis   ? Coronary artery disease   ? Decreased hearing   ? has hearing aids/Bil  ? Degenerative lumbar spinal stenosis   ? Diabetes mellitus without complication (HCC)   ? GERD (gastroesophageal reflux disease)   ? Heart disease   ? Hyperlipemia   ? Iron deficiency   ? Lower extremity pain   ? Myocardial infarction [facility]) 2008  ? Bypass surg  ? Nonintractable chronic migraine   ? no per daughter  ? ? ?Past Surgical History:  ?Procedure Laterality Date  ? CARDIAC CATHETERIZATION    ? 2006  ? CORONARY ARTERY BYPASS GRAFT    ? 2006  ? LUMBAR LAMINECTOMY/DECOMPRESSION MICRODISCECTOMY Left 05/02/2015  ? Procedure: Bilateral Lumbar four five, Left  Lumbar five Sacral one Laminectomy;  Surgeon: Barnett Abu, MD;  Location: [facility];  Service: Neurosurgery;  Laterality: Left;  Left L4-5 L5-S1 Laminectomy  ? ? ?Family History  ?Problem Relation Age of Onset  ? Stroke Father   ? Diabetes Sister   ? Hypertension Sister   ? Diabetes Brother   ? Cancer - Colon Neg Hx   ? Esophageal cancer Neg Hx   ? ? ?Social History:  reports that he has never smoked. He has never used smokeless tobacco. He reports that he does not drink alcohol and does not use drugs. ? ?Allergies:  ?Allergies  ?Allergen Reactions  ? Oxycodone Other (See Comments)  ?  Caused bowel  obstruction ?Caused bowel obstruction ?Caused bowel obstruction ?Caused bowel obstruction ?Intestinal Obstruction  ?  ? Pork-Derived Products   ? ? ?Medications: I have reviewed the patient's current medications. ? ?Results for orders placed or performed during the hospital encounter of 11/28/21 (from the past 48 hour(s))  ?Comprehensive metabolic panel     Status: Abnormal  ? Collection Time: 11/28/21 12:45 PM  ?Result Value Ref Range  ? Sodium 128 (L) 135 - 145 mmol/L  ? Potassium 4.3 3.5 - 5.1 mmol/L  ? Chloride 100 98 - 111 mmol/L  ? CO2 18 (L) 22 - 32 mmol/L  ? Glucose, Bld 207 (H) 70 - 99 mg/dL  ?  Comment: Glucose reference range applies only to samples taken after fasting for at least 8 hours.  ? BUN 24 (H) 8 - 23 mg/dL  ? Creatinine, Ser 1.01 0.61 - 1.24 mg/dL  ? Calcium 9.0 8.9 - 10.3 mg/dL  ? Total Protein 8.0 6.5 - 8.1 g/dL  ? Albumin 3.7 3.5 - 5.0 g/dL  ? AST 24 15 - 41 U/L  ? ALT 17 0 - 44 U/L  ? Alkaline Phosphatase 73 38 - 126 U/L  ? Total Bilirubin 0.9 0.3 - 1.2 mg/dL  ? GFR, Estimated >60 >60 mL/min  ?  Comment: (  NOTE) ?Calculated using the CKD-EPI Creatinine Equation (2021) ?  ? Anion gap 10 5 - 15  ?  Comment: Performed at [facility], [address] [place], [place] [address]  ?CBC with Differential     Status: Abnormal  ? Collection Time: 11/28/21 12:45 PM  ?Result Value Ref Range  ? WBC 12.8 (H) 4.0 - 10.5 K/uL  ? RBC 4.39 4.22 - 5.81 MIL/uL  ? Hemoglobin 12.5 (L) 13.0 - 17.0 g/dL  ? HCT 38.3 (L) 39.0 - 52.0 %  ? MCV 87.2 80.0 - 100.0 fL  ? MCH 28.5 26.0 - 34.0 pg  ? MCHC 32.6 30.0 - 36.0 g/dL  ? RDW 13.5 11.5 - 15.5 %  ? Platelets 275 150 - 400 K/uL  ? nRBC 0.0 0.0 - 0.2 %  ? Neutrophils Relative % 88 %  ? Neutro Abs 11.3 (H) 1.7 - 7.7 K/uL  ? Lymphocytes Relative 5 %  ? Lymphs Abs 0.6 (L) 0.7 - 4.0 K/uL  ? Monocytes Relative 6 %  ? Monocytes Absolute 0.7 0.1 - 1.0 K/uL  ? Eosinophils Relative 0 %  ? Eosinophils Absolute 0.0 0.0 - 0.5 K/uL  ? Basophils Relative 0 %  ? Basophils  Absolute 0.0 0.0 - 0.1 K/uL  ? Immature Granulocytes 1 %  ? Abs Immature Granulocytes 0.07 0.00 - 0.07 K/uL  ?  Comment: Performed at [facility], [address] [place], [place] [address]  ?Lactic acid, plasma     Status: Abnormal  ? Collection Time: 11/28/21 12:50 PM  ?Result Value Ref Range  ? Lactic Acid, Venous 2.0 (HH) 0.5 - 1.9 mmol/L  ?  Comment: CRITICAL RESULT CALLED TO, READ BACK BY AND VERIFIED WITH: ?M.ROBERT,RN 1403 11/28/21 CLARK,S ?Performed at [facility], [address] [place], [place] [address] ?  ?Lactic acid, plasma     Status: Abnormal  ? Collection Time: 11/28/21  5:53 PM  ?Result Value Ref Range  ? Lactic Acid, Venous 2.7 (HH) 0.5 - 1.9 mmol/L  ?  Comment: CRITICAL VALUE NOTED.  VALUE IS CONSISTENT WITH PREVIOUSLY REPORTED AND CALLED VALUE. ?Performed at [facility], [address] [place], [place] [address] ?  ?Uric acid     Status: None  ? Collection Time: 11/28/21  9:59 PM  ?Result Value Ref Range  ? Uric Acid, Serum 3.8 3.7 - 8.6 mg/dL  ?  Comment: Performed at [facility], [address] [place], [place] [address]  ?Troponin I (High Sensitivity)     Status: None  ? Collection Time: 11/28/21  9:59 PM  ?Result Value Ref Range  ? Troponin I (High Sensitivity) 4 <18 ng/L  ?  Comment: (NOTE) ?Elevated high sensitivity troponin I (hsTnI) values and significant  ?changes across serial measurements may suggest ACS but many other  ?chronic and acute conditions are known to elevate hsTnI results.  ?Refer to the "Links" section for chest pain algorithms and additional  ?guidance. ?Performed at [facility], [address] [place], [place] [address] ?  ?TSH     Status: None  ? Collection Time: 11/28/21 10:00 PM  ?Result Value Ref Range  ? TSH 0.981 0.350 - 4.500 uIU/mL  ?  Comment: Performed by a 3rd Generation assay with a functional sensitivity of <=0.01 uIU/mL. ?Performed at [facility], [address] [place], [place] [address] ?  ?Troponin I (High  Sensitivity)     Status: Abnormal  ?  Collection Time: 11/29/21 12:55 AM  ?Result Value Ref Range  ? Troponin I (High Sensitivity) 20 (H) <18 ng/L  ?  Comment: (NOTE) ?Elevated high sensitivity troponin I (hsTnI) values and significant  ?changes across serial measurements may suggest ACS but many other  ?chronic and acute conditions are known to elevate hsTnI results.  ?Refer to the "Links" section for chest pain algorithms and additional  ?guidance. ?Performed at [facility], [address] [place], [place] ?[address] ?  ?CBG monitoring, ED     Status: Abnormal  ? Collection Time: 11/29/21  2:34 AM  ?Result Value Ref Range  ? Glucose-Capillary 218 (H) 70 - 99 mg/dL  ?  Comment: Glucose reference range applies only to samples taken after fasting for at least 8 hours.  ?CBC     Status: Abnormal  ? Collection Time: 11/29/21  5:43 AM  ?Result Value Ref Range  ? WBC 11.2 (H) 4.0 - 10.5 K/uL  ? RBC 4.08 (L) 4.22 - 5.81 MIL/uL  ? Hemoglobin 11.9 (L) 13.0 - 17.0 g/dL  ? HCT 34.9 (L) 39.0 - 52.0 %  ? MCV 85.5 80.0 - 100.0 fL  ? MCH 29.2 26.0 - 34.0 pg  ? MCHC 34.1 30.0 - 36.0 g/dL  ? RDW 13.6 11.5 - 15.5 %  ? Platelets 291 150 - 400 K/uL  ? nRBC 0.0 0.0 - 0.2 %  ?  Comment: Performed at [facility], [address], [place], [place] [address]  ?Comprehensive metabolic panel     Status: Abnormal  ? Collection Time: 11/29/21  5:43 AM  ?Result Value Ref Range  ? Sodium 132 (L) 135 - 145 mmol/L  ? Potassium 4.3 3.5 - 5.1 mmol/L  ? Chloride 103 98 - 111 mmol/L  ? CO2 22 22 - 32 mmol/L  ? Glucose, Bld 186 (H) 70 - 99 mg/dL  ?  Comment: Glucose reference range applies only to samples taken after fasting for at least 8 hours.  ? BUN 27 (H) 8 - 23 mg/dL  ? Creatinine, Ser 1.24 0.61 - 1.24 mg/dL  ? Calcium 9.0 8.9 - 10.3 mg/dL  ? Total Protein 7.3 6.5 - 8.1 g/dL  ? Albumin 3.2 (L) 3.5 - 5.0 g/dL  ? AST 21 15 - 41 U/L  ? ALT 18 0 - 44 U/L  ? Alkaline Phosphatase 61 38 - 126 U/L  ? Total Bilirubin 0.6 0.3 - 1.2 mg/dL  ?  GFR, Estimated 58 (L) >60 mL/min  ?  Comment: (NOTE) ?Calculated using the CKD-EPI Creatinine Equation (2021) ?  ? Anion gap 7 5 - 15  ?  Comment: Performed at [facility], [address], [place], [place] [address]

## 2021-11-29 NOTE — Progress Notes (Signed)
EEG complete - results pending 

## 2021-11-29 NOTE — Procedures (Signed)
Patient Name: Paul Villarreal  ?MRN: [id number]  ?Epilepsy Attending: Charlsie Quest  ?Referring Physician/Provider: Hillary Bow, DO ?Date: 11/29/2021 ?Duration: 22.33 mins ? ?Patient history: 81yo M with 2 episodes of decreased responsiveness: 1 with syncope, 1 with loss of function of R arm. EEG to evaluate for seizure ? ?Level of alertness: Awake ? ?AEDs during EEG study:  ? ?Technical aspects: This EEG study was done with scalp electrodes positioned according to the 10-20 International system of electrode placement. Electrical activity was acquired at a sampling rate of 500Hz  and reviewed with a high frequency filter of 70Hz  and a low frequency filter of 1Hz . EEG data were recorded continuously and digitally stored.  ? ?Description: The posterior dominant rhythm consists of 10-11 Hz activity of moderate voltage (25-35 uV) seen predominantly in posterior head regions, symmetric and reactive to eye opening and eye closing. There is 15 to 18 Hz beta activity in frontocentral region. Hyperventilation and photic stimulation were not performed.    ? ?IMPRESSION: ?This study is within normal limits. No seizures or epileptiform discharges were seen throughout the recording. ? ?  ? ?

## 2021-11-29 NOTE — ED Notes (Addendum)
Report given to Anna-PM ?

## 2021-11-29 NOTE — ED Notes (Signed)
Admitting Providers at bedside at this time to evaluate patient ?

## 2021-11-30 ENCOUNTER — Encounter (HOSPITAL_COMMUNITY): Payer: Self-pay

## 2021-11-30 DIAGNOSIS — E119 Type 2 diabetes mellitus without complications: Secondary | ICD-10-CM | POA: Diagnosis not present

## 2021-11-30 DIAGNOSIS — R55 Syncope and collapse: Secondary | ICD-10-CM | POA: Diagnosis not present

## 2021-11-30 DIAGNOSIS — M79601 Pain in right arm: Secondary | ICD-10-CM | POA: Diagnosis not present

## 2021-11-30 DIAGNOSIS — M19041 Primary osteoarthritis, right hand: Secondary | ICD-10-CM

## 2021-11-30 LAB — GLUCOSE, CAPILLARY: Glucose-Capillary: 126 mg/dL — ABNORMAL HIGH (ref 70–99)

## 2021-11-30 LAB — CBC
HCT: 33.4 % — ABNORMAL LOW (ref 39.0–52.0)
Hemoglobin: 11.3 g/dL — ABNORMAL LOW (ref 13.0–17.0)
MCH: 28.9 pg (ref 26.0–34.0)
MCHC: 33.8 g/dL (ref 30.0–36.0)
MCV: 85.4 fL (ref 80.0–100.0)
Platelets: 276 10*3/uL (ref 150–400)
RBC: 3.91 MIL/uL — ABNORMAL LOW (ref 4.22–5.81)
RDW: 13.7 % (ref 11.5–15.5)
WBC: 11.8 10*3/uL — ABNORMAL HIGH (ref 4.0–10.5)
nRBC: 0 % (ref 0.0–0.2)

## 2021-11-30 LAB — BASIC METABOLIC PANEL
Anion gap: 6 (ref 5–15)
BUN: 25 mg/dL — ABNORMAL HIGH (ref 8–23)
CO2: 23 mmol/L (ref 22–32)
Calcium: 8.4 mg/dL — ABNORMAL LOW (ref 8.9–10.3)
Chloride: 105 mmol/L (ref 98–111)
Creatinine, Ser: 0.96 mg/dL (ref 0.61–1.24)
GFR, Estimated: >60 mL/min (ref >60)
Glucose, Bld: 120 mg/dL — ABNORMAL HIGH (ref 70–99)
Potassium: 3.8 mmol/L (ref 3.5–5.1)
Sodium: 134 mmol/L — ABNORMAL LOW (ref 135–145)

## 2021-11-30 MED ORDER — NAPROXEN 250 MG PO TABS: 250.0000 mg | ORAL_TABLET | Freq: Two times a day (BID) | ORAL | 0 refills | Status: AC

## 2021-11-30 MED ORDER — CEPHALEXIN 500 MG PO CAPS: 500.0000 mg | ORAL_CAPSULE | Freq: Two times a day (BID) | ORAL | 0 refills | Status: AC

## 2021-11-30 NOTE — TOC Transition Note (Signed)
Transition of Care (TOC) - CM/SW Discharge Note ? ? ?Patient Details  ?Name: Paul Villarreal ?MRN: [id number] ?Date of Birth: [birth year] ? ?Transition of Care (TOC) CM/SW Contact:  ?Pollie Friar, RN ?Phone Number: ?11/30/2021, 11:01 AM ? ? ?Clinical Narrative:    ?Patient is from home with daughter and son in law. He is discharging home today with no TOC needs. Pt states his SIL will provide transport home.  ?Daughter upset about cellulitis not being treated at admission. CM has provided her the Office of patient experience number.  ? ? ?Final next level of care: Home/Self Care ?Barriers to Discharge: No Barriers Identified ? ? ?Patient Goals and CMS Choice ?  ?  ?  ? ?Discharge Placement ?  ?           ?  ?  ?  ?  ? ?Discharge Plan and Services ?  ?  ?           ?  ?  ?  ?  ?  ?  ?  ?  ?  ?  ? ?Social Determinants of Health (SDOH) Interventions ?  ? ? ?Readmission Risk Interventions ?   ? View : No data to display.  ?  ?  ?  ? ? ? ? ? ?

## 2021-11-30 NOTE — Discharge Summary (Signed)
? ?Physician Discharge Summary  ?Virgel Gess MPN:[id number] DOB: 07/11/1941 DOA: 11/28/2021 ? ?PCP: Garwin Brothers, MD ? ?Admit date: 11/28/2021 ?Discharge date: 11/30/2021 ? ?Admitted From: home ?Disposition:  home ? ?Recommendations for Outpatient Follow-up:  ?Follow up with PCP in 1-2 weeks ?Please obtain BMP/CBC in one week ?Zio cardiac monitor as an outpatient ? ?Home Health: none ?Equipment/Devices: none ? ?Discharge Condition: stable ?CODE STATUS: Full code ?Diet Orders (From admission, onward)  ? ?  Start     Ordered  ? 11/30/21 0942  Diet regular Room service appropriate? Yes; Fluid consistency: Thin  Diet effective now       ?Question Answer Comment  ?Room service appropriate? Yes   ?Fluid consistency: Thin   ?  ? 11/30/21 0941  ? ?  ?  ? ?  ? ? ?HPI: Per admitting MD, ?Paul Villarreal is a 81 y.o. male with medical history significant of CAD s/p CABG, DM2, HLD. Pt had syncopal episode last night, unk duration of LOC.  His son found him with fecal soiling and at that time the patient was complaining of pain in his right hand.  Since then the patient has also complained of neck pain and headache. Second episode of decreased responsiveness while eating breakfast this AM.  Sudden loss of use of R hand.  Lasted a few mins.  In to ED around noon. ? ?Hospital Course / Discharge diagnoses: ?Principal Problem: ?  Syncope ?Active Problems: ?  Right arm pain ?  Type 2 diabetes mellitus without complication (HCC) ?  Diabetic neuropathy ([place]) ? ? ?Principal problem ?Syncope-2 episodes of decreased responsiveness, 1 with syncope, 1 with loss of function of the right arm.  Glucose was checked at home and he was not hypoglycemic.  Patient underwent an MRI of the brain which was negative for acute strokes.  CT angiogram was without large vessel occlusions.  CT venous was negative for dural venous thrombosis.  EEG is unremarkable and did not show any epileptiform discharges.  2D echo is unremarkable.  Neurology consulted  given recurrent episodes without a clear etiology, discussed with Dr. Leonel Ramsay, etiology still remains unclear at this point.  Telemetry review while hospitalized did not reveal any cardiac arrhythmias.  Given negative neurologic work-up, recommends a Zio patch as an outpatient, cardiology contacted prior to discharge and will arrange.  Alternatively, I have spoken with the patient's daughter, she will attempt to reach out to patient's primary cardiologist for this, he is at [facility]. ?  ?Active problems ?Right wrist swelling/erythema due to probable cellulitis-at the base of the right thumb.  Etiology is not entirely clear, MRI showed mild edema within the palmar thenar muscles, possible muscle strain versus nonspecific myositis.  With elevated WBC and elevated lactic acid cannot rule out cellulitis, patient was started on antibiotics along with NSAIDs due to concern for an OA flare.  Hand surgery consulted and evaluated patient.  With antibiotics and NSAIDs, his pain has resolved, redness is improving, and will convert to Keflex to treat as cellulitis as an outpatient. ?Coronary artery disease, history of CABG-no chest pain.  Monitor on telemetry.  Troponins flat, not in a pattern consistent with ACS.  To the echo shows normal EF 00-86%, grade 2 diastolic dysfunction.  He has mild mitral valve regurgitation and stenosis and moderate to severe mitral annular calcification.  There is moderate calcification of the aortic valve.  PA pressure was normal RV was normal.  Continue aspirin, metoprolol.  Cardiac monitor as an outpatient as  described above  ?Essential hypertension-continue metoprolol ?Leukocytosis-monitor, improving ?Elevated BUN-possibly dehydration.  Improving  ?Hyponatremia-mild, possibly dehydrated.  Improved with fluids, 128 on admission, 134 on discharge  ?Type 2 diabetes mellitus-no hypoglycemic events as above.  Resume home medications ? ?Sepsis ruled out ? ? ?Discharge Instructions ? ? ?Allergies  as of 11/30/2021   ? ?   Reactions  ? Oxycodone Other (See Comments)  ? Caused bowel obstruction ?Caused bowel obstruction ?Caused bowel obstruction ?Caused bowel obstruction ?Intestinal Obstruction   ? Pork-derived Products   ? ?  ? ?  ?Medication List  ?  ? ?TAKE these medications   ? ?aspirin 81 MG tablet ?Take 81 mg by mouth daily. ?  ?Blood Glucose Monitoring Suppl Kit ?by Does not apply route daily. ?  ?cephALEXin 500 MG capsule ?Commonly known as: KEFLEX ?Take 1 capsule (500 mg total) by mouth 2 (two) times daily for 6 days. ?  ?Dexlansoprazole 30 MG capsule DR ?Take 1 capsule (30 mg total) by mouth daily before breakfast. ?What changed: when to take this ?  ?gabapentin 300 MG capsule ?Commonly known as: NEURONTIN ?Take 300 mg by mouth at bedtime. ?  ?glucose blood test strip ?1 each by Other route daily. Use as instructed ?  ?ACCU-CHEK AVIVA PLUS VI ?1 strip by In Vitro route daily. ?  ?metFORMIN 500 MG (MOD) 24 hr tablet ?Commonly known as: Gregory ?Take 500 mg by mouth 2 (two) times daily with a meal. ?  ?metoprolol succinate 25 MG 24 hr tablet ?Commonly known as: TOPROL-XL ?Take 25 mg by mouth daily. ?  ?multivitamin with minerals Tabs tablet ?Take 1 tablet by mouth daily. Centrum Silver ?  ?naproxen 250 MG tablet ?Commonly known as: NAPROSYN ?Take 1 tablet (250 mg total) by mouth 2 (two) times daily with a meal for 3 days. ?  ?nitroGLYCERIN 0.4 MG SL tablet ?Commonly known as: NITROSTAT ?Place 0.4 mg under the tongue every 5 (five) minutes as needed for chest pain. ?  ?Praluent 75 MG/ML Soaj ?Generic drug: Alirocumab ?Inject 1 mL into the skin every 14 (fourteen) days. ?  ? ?  ? ? ? ?Consultations: ?Neurology  ?Hand surgery  ? ?Procedures/Studies: ? ?CT ANGIO HEAD NECK W WO CM ? ?Result Date: 11/28/2021 ?CLINICAL DATA:  Headache, right hand pain EXAM: CT ANGIOGRAPHY HEAD AND NECK TECHNIQUE: Multidetector CT imaging of the head and neck was performed using the standard protocol during bolus administration  of intravenous contrast. Multiplanar CT image reconstructions and MIPs were obtained to evaluate the vascular anatomy. Carotid stenosis measurements (when applicable) are obtained utilizing NASCET criteria, using the distal internal carotid diameter as the denominator. RADIATION DOSE REDUCTION: This exam was performed according to the departmental dose-optimization program which includes automated exposure control, adjustment of the mA and/or kV according to patient size and/or use of iterative reconstruction technique. CONTRAST:  49mL OMNIPAQUE IOHEXOL 350 MG/ML SOLN COMPARISON:  No prior CTA, correlation is made with 11/28/2021 CT head FINDINGS: CT HEAD FINDINGS For noncontrast findings, please see same day CT head. CTA NECK FINDINGS Aortic arch: Standard branching. Imaged portion shows no evidence of aneurysm or dissection. No significant stenosis of the major arch vessel origins. Aortic atherosclerosis. Right carotid system: No evidence of dissection, occlusion, or hemodynamically significant stenosis (greater than 50%). Plaque at the bifurcation and in the proximal right ICA is not hemodynamically significant. Left carotid system: No evidence of dissection, occlusion, or hemodynamically significant stenosis (greater than 50%). Plaque at the bifurcation and in the proximal left  ICA is not hemodynamically significant. Vertebral arteries: Left dominant. No evidence of dissection, occlusion, or hemodynamically significant stenosis (greater than 50%). Mild compression of the right vertebral artery by degenerative changes as it passes by the right neural foramen at C3-C4. Skeleton: No acute osseous abnormality. Degenerative changes in the cervical spine, with disc height loss, most prominent at C6-C7 and multilevel neural foraminal narrowing Other neck: No acute finding. Upper chest: Focal pulmonary opacity or pleural effusion. Air trapping. Review of the MIP images confirms the above findings CTA HEAD FINDINGS  Anterior circulation: Both internal carotid arteries are patent to the termini, with calcifications in the bilateral cavernous and supraclinoid portions that cause mild narrowing in the left supraclinoid IC

## 2021-11-30 NOTE — Progress Notes (Signed)
Patient ID: Paul Villarreal, male   DOB: 03-Sep-1940, 81 y.o.   MRN: [id number] ? ? LOS: 0 days  ? ?Subjective: ?Pain free this am ? ? ?Objective: ?Vital signs in last 24 hours: ?Temp:  [98.1 ?F (36.7 ?C)-98.7 ?F (37.1 ?C)] 98.1 ?F (36.7 ?C) (04/28 0739) ?Pulse Rate:  [63-83] 63 (04/28 0739) ?Resp:  [14-25] 14 (04/28 0739) ?BP: (111-145)/(64-76) 128/72 (04/28 0739) ?SpO2:  [97 %-100 %] 97 % (04/28 0739) ?Weight:  [70.1 kg-70.7 kg] 70.1 kg (04/28 0500) ?  ? ? ?Laboratory  ?CBC ?Recent Labs  ?  11/29/21 ?7829 11/30/21 ?5621  ?WBC 11.2* 11.8*  ?HGB 11.9* 11.3*  ?HCT 34.9* 33.4*  ?PLT 291 276  ? ?BMET ?Recent Labs  ?  11/29/21 ?3086 11/30/21 ?5784  ?NA 132* 134*  ?K 4.3 3.8  ?CL 103 105  ?CO2 22 23  ?GLUCOSE 186* 120*  ?BUN 27* 25*  ?CREATININE 1.24 0.96  ?CALCIUM 9.0 8.4*  ? ? ? ?Physical Exam ?General appearance: alert and no distress ?RUE: No TTP, no pain with wrist/MCP motion ? ? ?Assessment/Plan: ?Right hand pain -- Resolved ? ? ? ?Freeman Caldron, PA-C ?Orthopedic Surgery ?[phone] ?11/30/2021 ? ?

## 2021-11-30 NOTE — Care Management Obs Status (Signed)
MEDICARE OBSERVATION STATUS NOTIFICATION ? ? ?Patient Details  ?Name: Paul Villarreal ?MRN: [id number] ?Date of Birth: 19-Jun-1941 ? ? ?Medicare Observation Status Notification Given:  Yes ? ? ? ?Kermit Balo, RN ?11/30/2021, 11:00 AM ?

## 2021-11-30 NOTE — Progress Notes (Signed)
SWOT RN  printed discharge instructions--- no family present at that time, pt IV was d/c'd and telemetry was removed. SWOT RN contacted pt's family, pt's son n law was coming to pick pt up.   ? ?At 89 Son n law did not come up to unit, d/c instructions reviewed with son n law over the phone ( speaks English fluent as compared to the pt).  Informed that pt had 2 scripts to be picked up at pt's pharmacy.  ? ?At 1250 pt was d/c'd via wheelchair with belongings, escorted by unit staff. Son n law at main entrance.   ?

## 2021-12-04 DIAGNOSIS — R001 Bradycardia, unspecified: Secondary | ICD-10-CM | POA: Diagnosis not present

## 2021-12-04 DIAGNOSIS — R55 Syncope and collapse: Secondary | ICD-10-CM | POA: Diagnosis not present

## 2021-12-04 DIAGNOSIS — R002 Palpitations: Secondary | ICD-10-CM | POA: Diagnosis not present

## 2021-12-06 DIAGNOSIS — R55 Syncope and collapse: Secondary | ICD-10-CM | POA: Diagnosis not present

## 2021-12-06 DIAGNOSIS — E782 Mixed hyperlipidemia: Secondary | ICD-10-CM | POA: Diagnosis not present

## 2021-12-06 DIAGNOSIS — M79644 Pain in right finger(s): Secondary | ICD-10-CM | POA: Diagnosis not present

## 2021-12-06 DIAGNOSIS — E1169 Type 2 diabetes mellitus with other specified complication: Secondary | ICD-10-CM | POA: Diagnosis not present

## 2021-12-17 DIAGNOSIS — H43823 Vitreomacular adhesion, bilateral: Secondary | ICD-10-CM | POA: Diagnosis not present

## 2021-12-17 DIAGNOSIS — E113293 Type 2 diabetes mellitus with mild nonproliferative diabetic retinopathy without macular edema, bilateral: Secondary | ICD-10-CM | POA: Diagnosis not present

## 2021-12-17 DIAGNOSIS — H26492 Other secondary cataract, left eye: Secondary | ICD-10-CM | POA: Diagnosis not present

## 2021-12-17 DIAGNOSIS — H524 Presbyopia: Secondary | ICD-10-CM | POA: Diagnosis not present

## 2021-12-27 DIAGNOSIS — Z79899 Other long term (current) drug therapy: Secondary | ICD-10-CM | POA: Diagnosis not present

## 2021-12-27 DIAGNOSIS — E785 Hyperlipidemia, unspecified: Secondary | ICD-10-CM | POA: Diagnosis not present

## 2021-12-27 DIAGNOSIS — I2510 Atherosclerotic heart disease of native coronary artery without angina pectoris: Secondary | ICD-10-CM | POA: Diagnosis not present

## 2021-12-27 DIAGNOSIS — Z7984 Long term (current) use of oral hypoglycemic drugs: Secondary | ICD-10-CM | POA: Diagnosis not present

## 2021-12-27 DIAGNOSIS — I252 Old myocardial infarction: Secondary | ICD-10-CM | POA: Diagnosis not present

## 2021-12-27 DIAGNOSIS — J84112 Idiopathic pulmonary fibrosis: Secondary | ICD-10-CM | POA: Diagnosis not present

## 2021-12-27 DIAGNOSIS — L0390 Cellulitis, unspecified: Secondary | ICD-10-CM | POA: Diagnosis not present

## 2021-12-27 DIAGNOSIS — E119 Type 2 diabetes mellitus without complications: Secondary | ICD-10-CM | POA: Diagnosis not present

## 2021-12-27 DIAGNOSIS — F32A Depression, unspecified: Secondary | ICD-10-CM | POA: Diagnosis not present

## 2021-12-27 DIAGNOSIS — K219 Gastro-esophageal reflux disease without esophagitis: Secondary | ICD-10-CM | POA: Diagnosis not present

## 2021-12-27 DIAGNOSIS — R0609 Other forms of dyspnea: Secondary | ICD-10-CM | POA: Diagnosis not present

## 2021-12-27 DIAGNOSIS — R0602 Shortness of breath: Secondary | ICD-10-CM | POA: Diagnosis not present

## 2021-12-27 DIAGNOSIS — Z7982 Long term (current) use of aspirin: Secondary | ICD-10-CM | POA: Diagnosis not present

## 2021-12-27 DIAGNOSIS — Z951 Presence of aortocoronary bypass graft: Secondary | ICD-10-CM | POA: Diagnosis not present

## 2021-12-27 DIAGNOSIS — J849 Interstitial pulmonary disease, unspecified: Secondary | ICD-10-CM | POA: Diagnosis not present

## 2021-12-30 DIAGNOSIS — R55 Syncope and collapse: Secondary | ICD-10-CM | POA: Diagnosis not present

## 2021-12-30 DIAGNOSIS — E782 Mixed hyperlipidemia: Secondary | ICD-10-CM | POA: Diagnosis not present

## 2021-12-30 DIAGNOSIS — M79644 Pain in right finger(s): Secondary | ICD-10-CM | POA: Diagnosis not present

## 2021-12-30 DIAGNOSIS — E1169 Type 2 diabetes mellitus with other specified complication: Secondary | ICD-10-CM | POA: Diagnosis not present

## 2022-01-03 ENCOUNTER — Other Ambulatory Visit: Payer: Self-pay | Admitting: Internal Medicine

## 2022-01-03 DIAGNOSIS — J849 Interstitial pulmonary disease, unspecified: Secondary | ICD-10-CM

## 2022-01-04 ENCOUNTER — Ambulatory Visit
Admission: RE | Admit: 2022-01-04 | Discharge: 2022-01-04 | Disposition: A | Discharge status: Home / Self Care | Payer: Medicare Other | Source: Ambulatory Visit | Attending: Internal Medicine | Admitting: Internal Medicine

## 2022-01-04 DIAGNOSIS — J849 Interstitial pulmonary disease, unspecified: Secondary | ICD-10-CM | POA: Diagnosis not present

## 2022-01-04 IMAGING — CT CT CHEST W/O CM
2 of 5 series · 15 of 36 positions shown, 18 images · non-contrast
Comparison: [DATE] and [DATE].

CLINICAL DATA: Interstitial lung disease.



[Series 6: chest 2.00 br40 s3 · coronal · 0.60mm/px · 3 of 152 slices shown]
[im 31/152  lung]
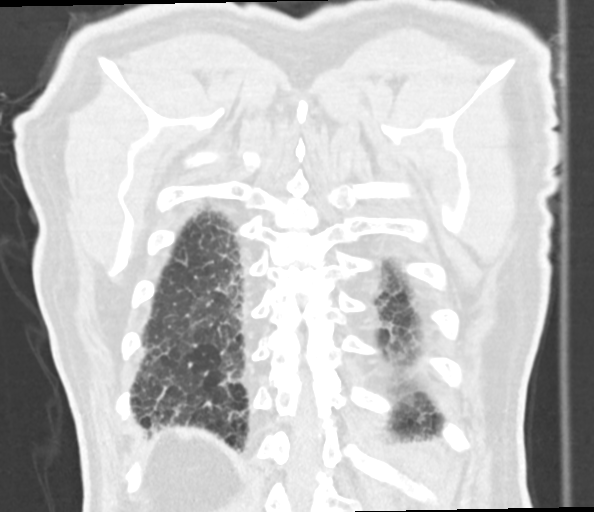
[im 61/152  lung]
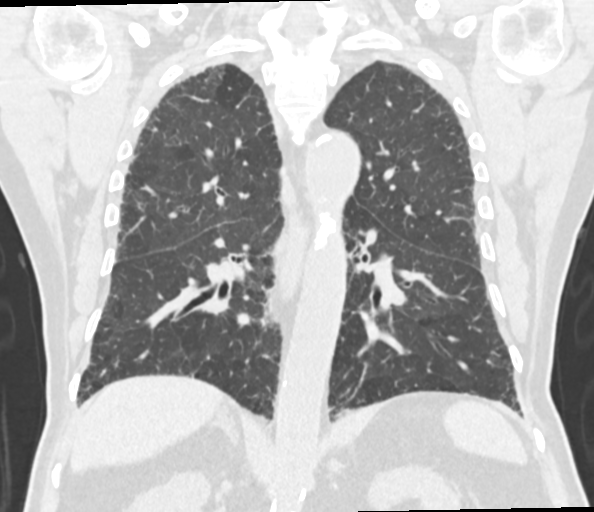
[im 91/152  lung]
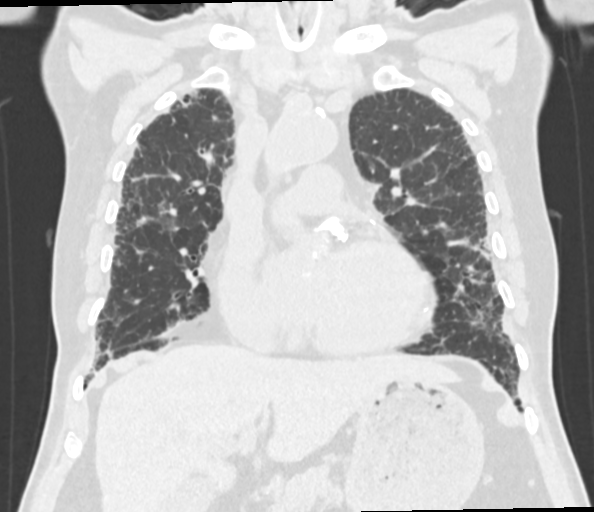

[Series 10: chest 1.00 br40 s3 super d · axial · 0.70mm/px · z∈[+1491,+1755]mm · 12 of 382 slices shown, 15 images]
[im 26/382  mediastinal]
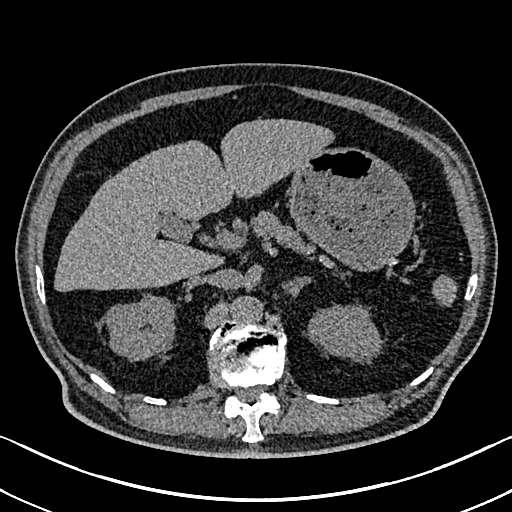
[im 26/382  lung]
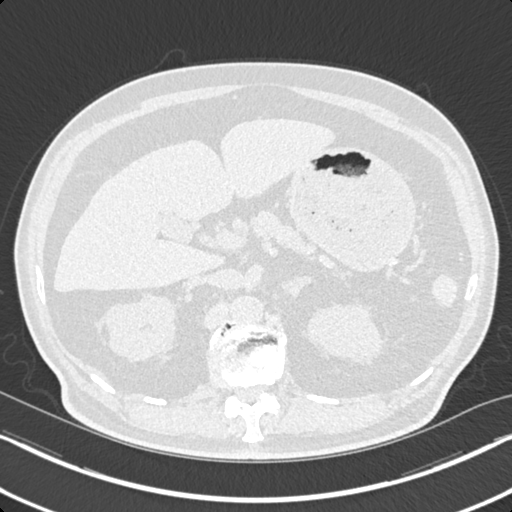
[im 51/382  lung]
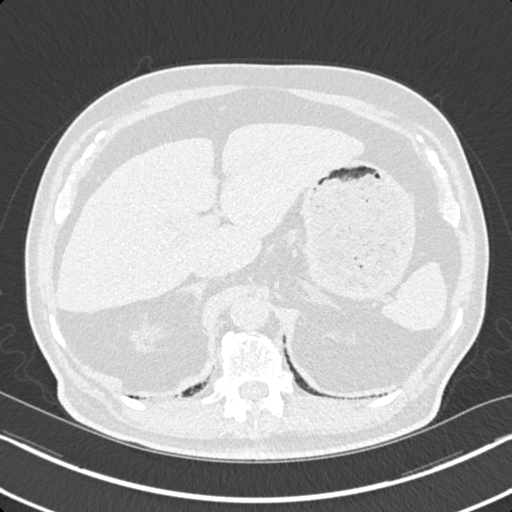
[im 77/382  lung]
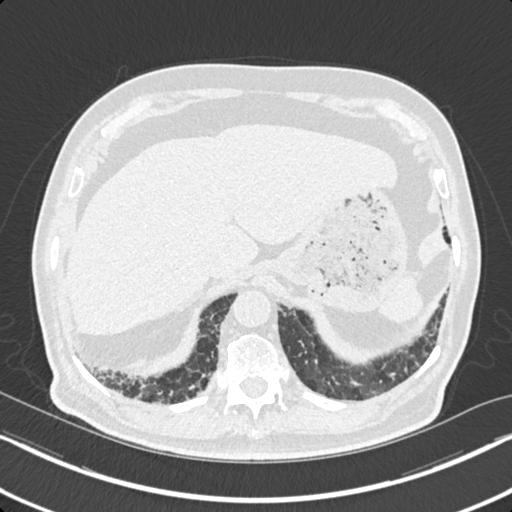
[im 128/382  lung]
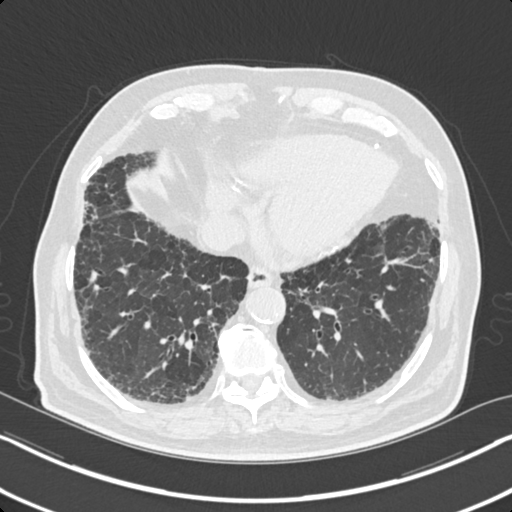
[im 153/382  mediastinal]
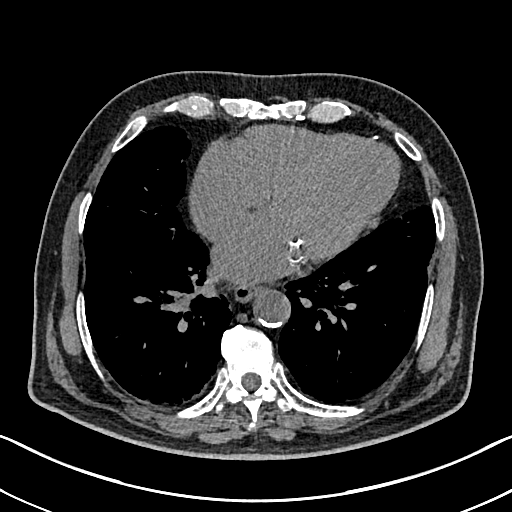
[im 153/382  lung]
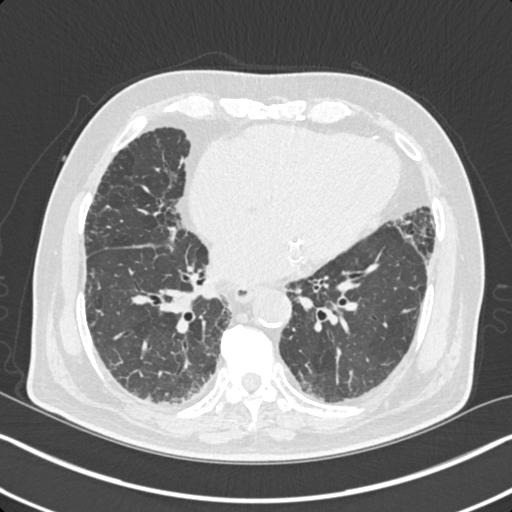
[im 178/382  lung]
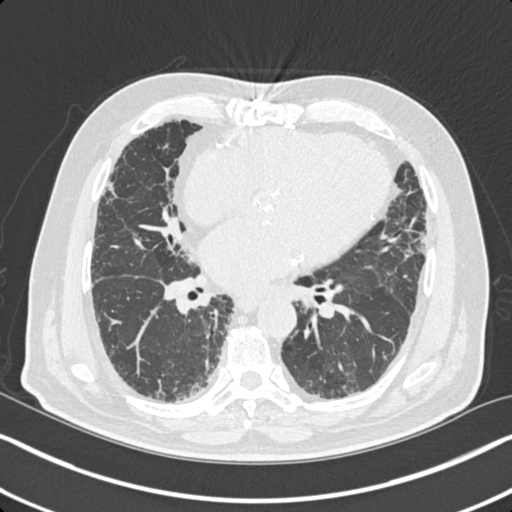
[im 204/382  lung]
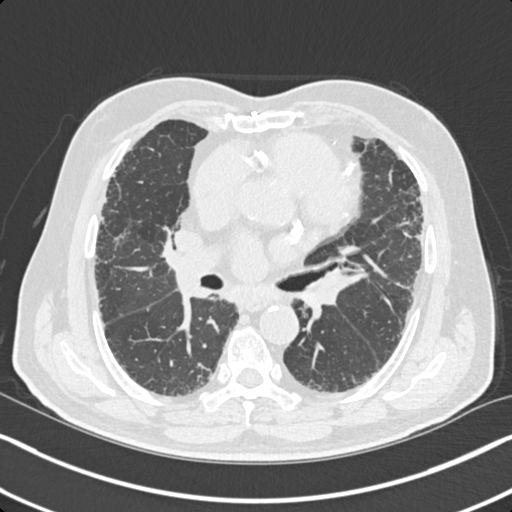
[im 229/382  lung]
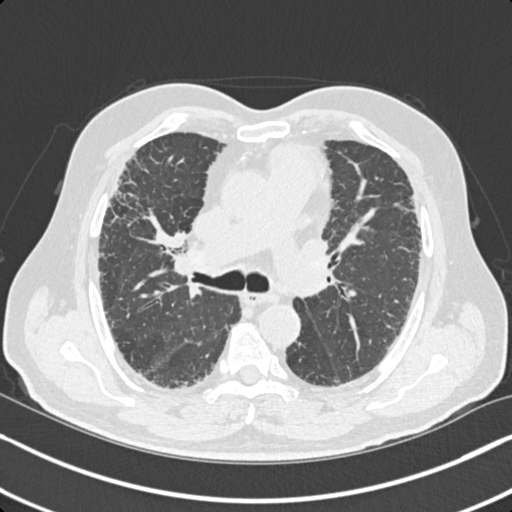
[im 255/382  mediastinal]
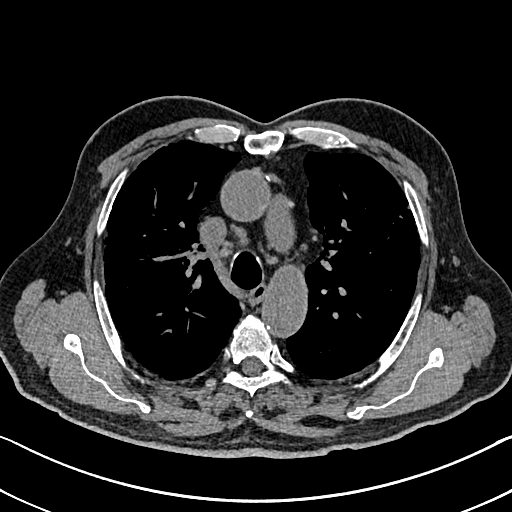
[im 255/382  lung]
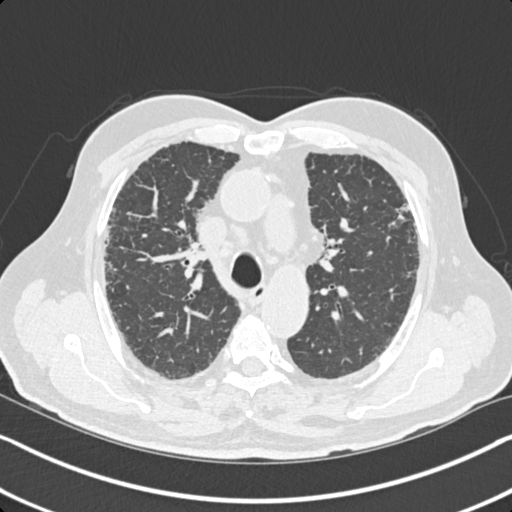
[im 305/382  lung]
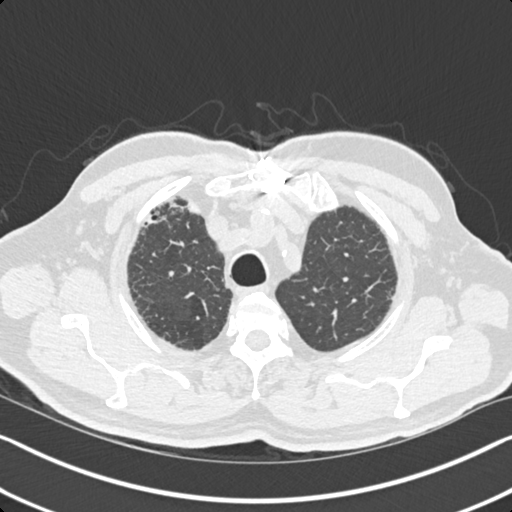
[im 331/382  lung]
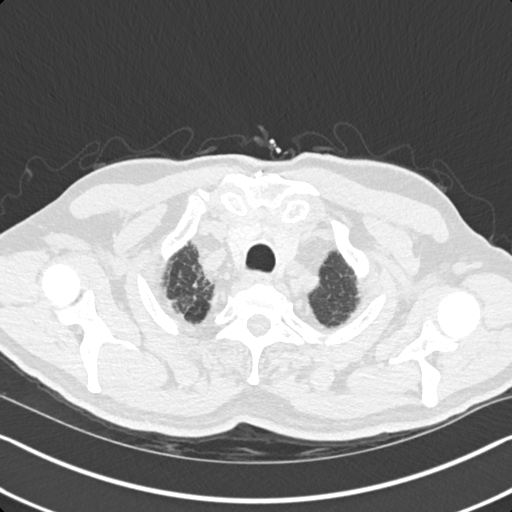
[im 356/382  lung]
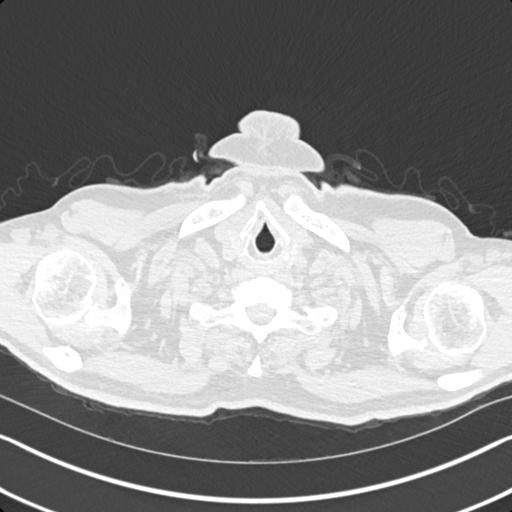

[15 of 36 positions shown; findings below may reference images not displayed]

FINDINGS: Cardiovascular: Atherosclerotic calcification of the aorta and
aortic valve. Heart is enlarged. No pericardial effusion.

Mediastinum/Nodes: Mediastinal lymph nodes measure up to 13 mm in
the low right paratracheal station, unchanged. Hilar regions are
difficult to evaluate without IV contrast. Esophagus is grossly
unremarkable.

Lungs/Pleura: Subpleural reticulation, ground-glass and traction
bronchiectasis/bronchiolectasis without a definite zonal
predominance. Findings are similar to [DATE]. Lungs are
otherwise clear. No pleural fluid. Airway is unremarkable.

Upper Abdomen: Liver margin is irregular. Stones in the gallbladder.
Slight thickening of the adrenal glands. No specific follow-up
necessary. Visualized portions of the kidneys, spleen, pancreas,
stomach and bowel are grossly unremarkable.

Musculoskeletal: Degenerative changes in the spine. No worrisome
lytic or sclerotic lesions.
IMPRESSION: 1. Pulmonary parenchymal pattern interstitial lung disease appears
grossly similar to [DATE] and may be due to fibrotic nonspecific
interstitial pneumonitis or usual interstitial pneumonitis.
2. Cirrhosis.
3. Cholelithiasis.
4.  Aortic atherosclerosis ([8D]-[8D]).

## 2022-01-14 ENCOUNTER — Encounter: Payer: Self-pay | Admitting: Neurology

## 2022-01-14 ENCOUNTER — Ambulatory Visit: Payer: Medicare Other | Admitting: Neurology

## 2022-03-19 DIAGNOSIS — H6123 Impacted cerumen, bilateral: Secondary | ICD-10-CM | POA: Diagnosis not present

## 2022-04-25 DIAGNOSIS — Z23 Encounter for immunization: Secondary | ICD-10-CM | POA: Diagnosis not present

## 2022-05-03 DIAGNOSIS — R0602 Shortness of breath: Secondary | ICD-10-CM | POA: Diagnosis not present

## 2022-05-03 DIAGNOSIS — I252 Old myocardial infarction: Secondary | ICD-10-CM | POA: Diagnosis not present

## 2022-05-03 DIAGNOSIS — I10 Essential (primary) hypertension: Secondary | ICD-10-CM | POA: Diagnosis not present

## 2022-05-03 DIAGNOSIS — I25798 Atherosclerosis of other coronary artery bypass graft(s) with other forms of angina pectoris: Secondary | ICD-10-CM | POA: Diagnosis not present

## 2022-05-03 DIAGNOSIS — I38 Endocarditis, valve unspecified: Secondary | ICD-10-CM | POA: Diagnosis not present

## 2022-05-03 DIAGNOSIS — E785 Hyperlipidemia, unspecified: Secondary | ICD-10-CM | POA: Diagnosis not present

## 2022-05-03 DIAGNOSIS — R002 Palpitations: Secondary | ICD-10-CM | POA: Diagnosis not present

## 2022-05-06 DIAGNOSIS — I25798 Atherosclerosis of other coronary artery bypass graft(s) with other forms of angina pectoris: Secondary | ICD-10-CM | POA: Diagnosis not present

## 2022-05-06 DIAGNOSIS — E785 Hyperlipidemia, unspecified: Secondary | ICD-10-CM | POA: Diagnosis not present

## 2022-05-06 DIAGNOSIS — R0602 Shortness of breath: Secondary | ICD-10-CM | POA: Diagnosis not present

## 2022-05-06 DIAGNOSIS — I10 Essential (primary) hypertension: Secondary | ICD-10-CM | POA: Diagnosis not present

## 2022-05-06 DIAGNOSIS — R002 Palpitations: Secondary | ICD-10-CM | POA: Diagnosis not present

## 2022-05-24 DIAGNOSIS — E782 Mixed hyperlipidemia: Secondary | ICD-10-CM | POA: Diagnosis not present

## 2022-05-24 DIAGNOSIS — R350 Frequency of micturition: Secondary | ICD-10-CM | POA: Diagnosis not present

## 2022-05-24 DIAGNOSIS — R21 Rash and other nonspecific skin eruption: Secondary | ICD-10-CM | POA: Diagnosis not present

## 2022-05-24 DIAGNOSIS — E1142 Type 2 diabetes mellitus with diabetic polyneuropathy: Secondary | ICD-10-CM | POA: Diagnosis not present

## 2022-06-12 DIAGNOSIS — H33193 Other retinoschisis and retinal cysts, bilateral: Secondary | ICD-10-CM | POA: Diagnosis not present

## 2022-06-12 DIAGNOSIS — H43823 Vitreomacular adhesion, bilateral: Secondary | ICD-10-CM | POA: Diagnosis not present

## 2022-06-12 DIAGNOSIS — E113293 Type 2 diabetes mellitus with mild nonproliferative diabetic retinopathy without macular edema, bilateral: Secondary | ICD-10-CM | POA: Diagnosis not present

## 2022-07-01 ENCOUNTER — Ambulatory Visit (INDEPENDENT_AMBULATORY_CARE_PROVIDER_SITE_OTHER): Payer: Medicare Other | Admitting: Internal Medicine

## 2022-07-01 VITALS — BP 108/62 | HR 78 | Temp 97.8°F | Resp 20 | Ht 64.0 in | Wt 149.4 lb

## 2022-07-01 DIAGNOSIS — F5104 Psychophysiologic insomnia: Secondary | ICD-10-CM

## 2022-07-01 DIAGNOSIS — L299 Pruritus, unspecified: Secondary | ICD-10-CM | POA: Diagnosis not present

## 2022-07-01 DIAGNOSIS — R5383 Other fatigue: Secondary | ICD-10-CM

## 2022-07-01 DIAGNOSIS — F320 Major depressive disorder, single episode, mild: Secondary | ICD-10-CM | POA: Insufficient documentation

## 2022-07-01 MED ORDER — DULOXETINE HCL 30 MG PO CPEP
30.0000 mg | ORAL_CAPSULE | Freq: Every day | ORAL | 2 refills | Status: DC
Start: 2022-07-01 — End: 2022-07-31

## 2022-07-01 MED ORDER — MIRTAZAPINE 7.5 MG PO TABS
7.5000 mg | ORAL_TABLET | Freq: Every day | ORAL | 2 refills | Status: DC
Start: 2022-07-01 — End: 2022-07-31

## 2022-07-01 MED ORDER — FEXOFENADINE HCL 180 MG PO TABS: 180.0000 mg | ORAL_TABLET | Freq: Every day | ORAL | 1 refills | Status: DC

## 2022-07-01 NOTE — Progress Notes (Deleted)
   Established Patient Office Visit  Subjective   Patient ID: Paul Villarreal, male    DOB: 17-Jan-1941  Age: 81 y.o. MRN: [id number]  Chief Complaint  Patient presents with   Fatigue    No appetite, itching all over and weakness    HPI 81 years old male is here c/o    Review of Systems  Constitutional:  Positive for malaise/fatigue. Negative for chills and fever.  HENT: Negative.    Respiratory: Negative.    Neurological:  Positive for tingling.      Objective:     BP 108/62 (BP Location: Right Arm, Patient Position: Sitting, Cuff Size: Normal)   Pulse 78   Temp 97.8 F (36.6 C) (Temporal)   Resp 20   Ht 5\' 4"  (1.626 m)   Wt 149 lb 6.4 oz (67.8 kg)   SpO2 96%   BMI 25.64 kg/m  {Vitals History (Optional):23777}  Physical Exam Constitutional:      Appearance: Normal appearance.  HENT:     Head: Normocephalic and atraumatic.  Eyes:     Extraocular Movements: Extraocular movements intact.     Pupils: Pupils are equal, round, and reactive to light.  Pulmonary:     Effort: Pulmonary effort is normal.     Breath sounds: Normal breath sounds.  Abdominal:     General: Bowel sounds are normal.     Palpations: Abdomen is soft.  Neurological:     General: No focal deficit present.     Mental Status: He is alert and oriented to person, place, and time.      No results found for any visits on 07/01/22.  {Labs (Optional):23779}  The ASCVD Risk score (Arnett DK, et al., 2019) failed to calculate for the following reasons:   The 2019 ASCVD risk score is only valid for ages 77 to 76   The patient has a prior MI or stroke diagnosis    Assessment & Plan:   Problem List Items Addressed This Visit   None   No follow-ups on file.    76, MD

## 2022-07-02 LAB — CBC WITH DIFFERENTIAL/PLATELET
Basophils Absolute: 0.1 10*3/uL (ref 0.0–0.2)
Basos: 1 %
EOS (ABSOLUTE): 0.2 10*3/uL (ref 0.0–0.4)
Eos: 3 %
Hematocrit: 39.2 % (ref 37.5–51.0)
Hemoglobin: 12.7 g/dL — ABNORMAL LOW (ref 13.0–17.7)
Immature Grans (Abs): 0.1 10*3/uL (ref 0.0–0.1)
Immature Granulocytes: 1 %
Lymphocytes Absolute: 1.3 10*3/uL (ref 0.7–3.1)
Lymphs: 17 %
MCH: 27.5 pg (ref 26.6–33.0)
MCHC: 32.4 g/dL (ref 31.5–35.7)
MCV: 85 fL (ref 79–97)
Monocytes Absolute: 1.1 10*3/uL — ABNORMAL HIGH (ref 0.1–0.9)
Monocytes: 15 %
Neutrophils Absolute: 4.7 10*3/uL (ref 1.4–7.0)
Neutrophils: 63 %
Platelets: 327 10*3/uL (ref 150–450)
RBC: 4.61 x10E6/uL (ref 4.14–5.80)
RDW: 13.1 % (ref 11.6–15.4)
WBC: 7.4 10*3/uL (ref 3.4–10.8)

## 2022-07-02 LAB — CMP14 + ANION GAP
ALT: 15 IU/L (ref 0–44)
AST: 23 IU/L (ref 0–40)
Albumin/Globulin Ratio: 1.2 (ref 1.2–2.2)
Albumin: 3.9 g/dL (ref 3.7–4.7)
Alkaline Phosphatase: 85 IU/L (ref 44–121)
Anion Gap: 15 mmol/L (ref 10.0–18.0)
BUN/Creatinine Ratio: 19 (ref 10–24)
BUN: 22 mg/dL (ref 8–27)
Bilirubin Total: 0.3 mg/dL (ref 0.0–1.2)
CO2: 19 mmol/L — ABNORMAL LOW (ref 20–29)
Calcium: 8.6 mg/dL (ref 8.6–10.2)
Chloride: 98 mmol/L (ref 96–106)
Creatinine, Ser: 1.14 mg/dL (ref 0.76–1.27)
Globulin, Total: 3.3 g/dL (ref 1.5–4.5)
Glucose: 129 mg/dL — ABNORMAL HIGH (ref 70–99)
Potassium: 5.3 mmol/L — ABNORMAL HIGH (ref 3.5–5.2)
Sodium: 132 mmol/L — ABNORMAL LOW (ref 134–144)
Total Protein: 7.2 g/dL (ref 6.0–8.5)
eGFR: 65 mL/min/{1.73_m2} (ref >59)

## 2022-07-02 NOTE — Progress Notes (Signed)
Established Patient Office Visit  Subjective   Patient ID: Paul Villarreal, male    DOB: 07-30-1941  Age: 81 y.o. MRN: [id number]  Chief Complaint  Patient presents with   Fatigue    No appetite, itching all over and weakness    HPI 81 years old male who is here because of not feeling well.  Per daughter patient seems depressed as he does not participate in any activity and stay in his room most of the time.  Patient says that his appetite is not good.  He is not able to sleep and stay tired.  He is also complaining of itching all over the last few days.  He has been using moisturizing cream without any help.  Patient denies being depressed.  He has tried different medication before without any effect.   Review of Systems  Constitutional:  Positive for malaise/fatigue. Negative for chills and fever.  Cardiovascular: Negative.   Skin:  Positive for itching. Negative for rash.  Neurological:  Positive for weakness.  Psychiatric/Behavioral:  The patient has insomnia.       Objective:     BP 108/62 (BP Location: Right Arm, Patient Position: Sitting, Cuff Size: Normal)   Pulse 78   Temp 97.8 F (36.6 C) (Temporal)   Resp 20   Ht _0  (1.626 m)   Wt 149 lb 6.4 oz (67.8 kg)   SpO2 96%   BMI 25.64 kg/m   Physical Exam Constitutional:      Appearance: Normal appearance.  HENT:     Head: Normocephalic and atraumatic.  Cardiovascular:     Rate and Rhythm: Normal rate and regular rhythm.     Heart sounds: Normal heart sounds.  Pulmonary:     Effort: Pulmonary effort is normal.     Breath sounds: Normal breath sounds.  Abdominal:     General: Bowel sounds are normal.     Palpations: Abdomen is soft.  Neurological:     General: No focal deficit present.     Mental Status: He is alert and oriented to person, place, and time.      Results for orders placed or performed in visit on 07/01/22  CBC with Differential/Platelet  Result Value Ref Range   WBC 7.4 3.4 - 10.8  x10E3/uL   RBC 4.61 4.14 - 5.80 x10E6/uL   Hemoglobin 12.7 (L) 13.0 - 17.7 g/dL   Hematocrit 39.2 37.5 - 51.0 %   MCV 85 79 - 97 fL   MCH 27.5 26.6 - 33.0 pg   MCHC 32.4 31.5 - 35.7 g/dL   RDW 13.1 11.6 - 15.4 %   Platelets 327 150 - 450 x10E3/uL   Neutrophils 63 Not Estab. %   Lymphs 17 Not Estab. %   Monocytes 15 Not Estab. %   Eos 3 Not Estab. %   Basos 1 Not Estab. %   Neutrophils Absolute 4.7 1.4 - 7.0 x10E3/uL   Lymphocytes Absolute 1.3 0.7 - 3.1 x10E3/uL   Monocytes Absolute 1.1 (H) 0.1 - 0.9 x10E3/uL   EOS (ABSOLUTE) 0.2 0.0 - 0.4 x10E3/uL   Basophils Absolute 0.1 0.0 - 0.2 x10E3/uL   Immature Granulocytes 1 Not Estab. %   Immature Grans (Abs) 0.1 0.0 - 0.1 x10E3/uL  CMP14 + Anion Gap  Result Value Ref Range   Glucose 129 (H) 70 - 99 mg/dL   BUN 22 8 - 27 mg/dL   Creatinine, Ser 1.14 0.76 - 1.27 mg/dL   eGFR 65 >59 mL/min/1.73  BUN/Creatinine Ratio 19 10 - 24   Sodium 132 (L) 134 - 144 mmol/L   Potassium 5.3 (H) 3.5 - 5.2 mmol/L   Chloride 98 96 - 106 mmol/L   CO2 19 (L) 20 - 29 mmol/L   Anion Gap 15.0 10.0 - 18.0 mmol/L   Calcium 8.6 8.6 - 10.2 mg/dL   Total Protein 7.2 6.0 - 8.5 g/dL   Albumin 3.9 3.7 - 4.7 g/dL   Globulin, Total 3.3 1.5 - 4.5 g/dL   Albumin/Globulin Ratio 1.2 1.2 - 2.2   Bilirubin Total 0.3 0.0 - 1.2 mg/dL   Alkaline Phosphatase 85 44 - 121 IU/L   AST 23 0 - 40 IU/L   ALT 15 0 - 44 IU/L     The ASCVD Risk score (Arnett DK, et al., 2019) failed to calculate for the following reasons:   The 2019 ASCVD risk score is only valid for ages 18 to 89   The patient has a prior MI or stroke diagnosis    Assessment & Plan:   Problem List Items Addressed This Visit       Other   Itching   Fatigue - Primary   Relevant Orders   CBC with Differential/Platelet (Completed)   CMP14 + Anion Gap (Completed)   Current mild episode of major depressive disorder without prior episode (HCC)   Relevant Medications   DULoxetine (CYMBALTA) 30 MG capsule    mirtazapine (REMERON) 7.5 MG tablet   Psychophysiological insomnia    Return in about 1 month (around 07/31/2022).    Garwin Brothers, MD

## 2022-07-15 ENCOUNTER — Other Ambulatory Visit: Payer: Self-pay | Admitting: Internal Medicine

## 2022-07-15 MED ORDER — GABAPENTIN 400 MG PO CAPS: 400.0000 mg | ORAL_CAPSULE | Freq: Four times a day (QID) | ORAL | 6 refills | Status: DC

## 2022-07-15 MED ORDER — GABAPENTIN (ONCE-DAILY) 300 MG PO TABS: 300.0000 mg | ORAL_TABLET | Freq: Two times a day (BID) | ORAL | 4 refills | Status: DC

## 2022-07-15 NOTE — Progress Notes (Signed)
Gabapentin

## 2022-07-25 ENCOUNTER — Other Ambulatory Visit: Payer: Self-pay | Admitting: Internal Medicine

## 2022-07-31 ENCOUNTER — Other Ambulatory Visit: Payer: Self-pay

## 2022-09-03 ENCOUNTER — Other Ambulatory Visit: Payer: Self-pay

## 2022-09-03 MED ORDER — FEXOFENADINE HCL 180 MG PO TABS: 180.0000 mg | ORAL_TABLET | Freq: Every day | ORAL | 1 refills | Status: AC

## 2022-09-05 DIAGNOSIS — L2089 Other atopic dermatitis: Secondary | ICD-10-CM | POA: Diagnosis not present

## 2022-09-27 ENCOUNTER — Ambulatory Visit: Payer: Medicare Other | Admitting: Internal Medicine

## 2022-09-28 ENCOUNTER — Other Ambulatory Visit: Payer: Self-pay | Admitting: Internal Medicine

## 2022-10-08 ENCOUNTER — Encounter: Payer: Self-pay | Admitting: Internal Medicine

## 2022-10-08 ENCOUNTER — Ambulatory Visit: Payer: Medicare Other | Admitting: Internal Medicine

## 2022-10-08 VITALS — BP 140/80 | HR 66 | Temp 97.5°F | Resp 18 | Ht 64.0 in | Wt 153.2 lb

## 2022-10-08 DIAGNOSIS — I2510 Atherosclerotic heart disease of native coronary artery without angina pectoris: Secondary | ICD-10-CM | POA: Insufficient documentation

## 2022-10-08 DIAGNOSIS — K219 Gastro-esophageal reflux disease without esophagitis: Secondary | ICD-10-CM | POA: Diagnosis not present

## 2022-10-08 DIAGNOSIS — E1169 Type 2 diabetes mellitus with other specified complication: Secondary | ICD-10-CM | POA: Diagnosis not present

## 2022-10-08 DIAGNOSIS — I25718 Atherosclerosis of autologous vein coronary artery bypass graft(s) with other forms of angina pectoris: Secondary | ICD-10-CM

## 2022-10-08 DIAGNOSIS — E871 Hypo-osmolality and hyponatremia: Secondary | ICD-10-CM

## 2022-10-08 DIAGNOSIS — E785 Hyperlipidemia, unspecified: Secondary | ICD-10-CM

## 2022-10-08 MED ORDER — DEXLANSOPRAZOLE 60 MG PO CPDR: 60.0000 mg | DELAYED_RELEASE_CAPSULE | Freq: Every day | ORAL | 6 refills | Status: DC

## 2022-10-08 NOTE — Assessment & Plan Note (Signed)
I will repeat sodium level and urine sodium level for evaluation.

## 2022-10-08 NOTE — Assessment & Plan Note (Signed)
I will repeat HbA1c and lipid panel today

## 2022-10-08 NOTE — Addendum Note (Signed)
Addended byGarwin Brothers on: 10/08/2022 11:02 AM   Modules accepted: Orders

## 2022-10-08 NOTE — Assessment & Plan Note (Signed)
He take praluent every 14 days

## 2022-10-08 NOTE — Progress Notes (Addendum)
Office Visit  Subjective   Patient ID: Paul Villarreal   DOB: [birth year]   Age: 82 y.o.   MRN: [id number]   Chief Complaint Chief Complaint  Patient presents with   Follow-up    Follow up      History of Present Illness 82 years old male who is here for follow-up.  He says that he has off-and-on pressure in the chest and in his head with the bitter taste in his mouth.  The symptoms are present even when he get up in the morning.  He has a history of acid reflux and taking Dexilant 30 mg daily. He has a history of diabetes and was taking glipizide and metformin because of controlled hemoglobin A1c I have stopped his glipizide and he take metformin.  His blood sugar been stable. He also has a dyslipidemia and he take Praluent injection every 14 days.  For some reason he is not taking any other medication.  He has coronary artery disease status post coronary artery bypass graft and then 2 stents were placed few years ago.  He is here for repeat lipid panel. He also has a hyponatremia that got better after he cut back his free water intake.  He says that he stopped drinking fluid because he does not feel thirsty anymore.  He is here for repeat sodium level.  Past Medical History Past Medical History:  Diagnosis Date   Anemia    Arthritis    Coronary artery disease    Decreased hearing    has hearing aids/Bil   Degenerative lumbar spinal stenosis    Diabetes mellitus without complication (HCC)    GERD (gastroesophageal reflux disease)    Heart disease    Hyperlipemia    Iron deficiency    Lower extremity pain    Myocardial infarction [facility]) 2008   Bypass surg   Nonintractable chronic migraine    no per daughter     Allergies Allergies  Allergen Reactions   Oxycodone Other (See Comments)    Caused bowel obstruction Caused bowel obstruction Caused bowel obstruction Caused bowel obstruction Intestinal Obstruction     Pork-Derived Products Other (See Comments)    Religious  indication     Review of Systems Review of Systems  Constitutional: Negative.   HENT: Negative.    Respiratory: Negative.    Cardiovascular: Negative.   Gastrointestinal:  Positive for heartburn.  Neurological: Negative.        Objective:    Vitals BP (!) 140/80 (BP Location: Left Arm, Patient Position: Sitting, Cuff Size: Normal)   Pulse 66   Temp (!) 97.5 F (36.4 C)   Resp 18   Ht '5\' 4"'$  (1.626 m)   Wt 153 lb 3 oz (69.5 kg)   SpO2 96%   BMI 26.29 kg/m    Physical Examination Physical Exam Constitutional:      Appearance: Normal appearance. He is normal weight.  HENT:     Head: Normocephalic and atraumatic.  Eyes:     Extraocular Movements: Extraocular movements intact.     Pupils: Pupils are equal, round, and reactive to light.  Cardiovascular:     Rate and Rhythm: Normal rate and regular rhythm.     Heart sounds: Normal heart sounds.  Pulmonary:     Effort: Pulmonary effort is normal.     Breath sounds: Normal breath sounds.  Abdominal:     General: Bowel sounds are normal.     Palpations: Abdomen is soft.  Neurological:  General: No focal deficit present.     Mental Status: He is alert and oriented to person, place, and time.        Assessment & Plan:   Coronary artery disease He take praluent every 14 days  GERD (gastroesophageal reflux disease) I will increase the dose of Dexilant as he still has symptoms inspite to taking 30 mg Dexilant daily.  Dyslipidemia associated with type 2 diabetes mellitus ([place]) I will repeat HbA1c and lipid panel today  Hyponatremia I will repeat sodium level and urine sodium level for evaluation.    Return in about 3 months (around 01/08/2023).   Garwin Brothers, MD

## 2022-10-08 NOTE — Assessment & Plan Note (Signed)
I will increase the dose of Dexilant as he still has symptoms inspite to taking 30 mg Dexilant daily.

## 2022-10-10 LAB — HEMOGLOBIN A1C
Est. average glucose Bld gHb Est-mCnc: 143 mg/dL
Hgb A1c MFr Bld: 6.6 % — ABNORMAL HIGH (ref 4.8–5.6)

## 2022-10-10 LAB — CMP14 + ANION GAP
ALT: 14 IU/L (ref 0–44)
AST: 21 IU/L (ref 0–40)
Albumin/Globulin Ratio: 1.3 (ref 1.2–2.2)
Albumin: 4.5 g/dL (ref 3.7–4.7)
Alkaline Phosphatase: 88 IU/L (ref 44–121)
Anion Gap: 17 mmol/L (ref 10.0–18.0)
BUN/Creatinine Ratio: 18 (ref 10–24)
BUN: 17 mg/dL (ref 8–27)
Bilirubin Total: 0.6 mg/dL (ref 0.0–1.2)
CO2: 21 mmol/L (ref 20–29)
Calcium: 9.8 mg/dL (ref 8.6–10.2)
Chloride: 99 mmol/L (ref 96–106)
Creatinine, Ser: 0.93 mg/dL (ref 0.76–1.27)
Globulin, Total: 3.6 g/dL (ref 1.5–4.5)
Glucose: 135 mg/dL — ABNORMAL HIGH (ref 70–99)
Potassium: 5.5 mmol/L — ABNORMAL HIGH (ref 3.5–5.2)
Sodium: 137 mmol/L (ref 134–144)
Total Protein: 8.1 g/dL (ref 6.0–8.5)
eGFR: 82 mL/min/{1.73_m2} (ref >59)

## 2022-10-10 LAB — LIPID PANEL
Chol/HDL Ratio: 2.9 ratio (ref 0.0–5.0)
Cholesterol, Total: 151 mg/dL (ref 100–199)
HDL: 52 mg/dL (ref >39)
LDL Chol Calc (NIH): 76 mg/dL (ref 0–99)
Triglycerides: 132 mg/dL (ref 0–149)
VLDL Cholesterol Cal: 23 mg/dL (ref 5–40)

## 2022-10-10 LAB — SODIUM, URINE, RANDOM: Sodium, Ur: 52 mmol/L

## 2022-10-20 ENCOUNTER — Other Ambulatory Visit: Payer: Self-pay | Admitting: Internal Medicine

## 2022-12-08 ENCOUNTER — Other Ambulatory Visit: Payer: Self-pay | Admitting: Internal Medicine

## 2023-01-09 ENCOUNTER — Ambulatory Visit: Payer: Medicare Other | Admitting: Internal Medicine

## 2023-03-11 DIAGNOSIS — H6123 Impacted cerumen, bilateral: Secondary | ICD-10-CM | POA: Diagnosis not present

## 2023-04-03 ENCOUNTER — Other Ambulatory Visit: Payer: Self-pay | Admitting: Internal Medicine

## 2023-04-09 DIAGNOSIS — I38 Endocarditis, valve unspecified: Secondary | ICD-10-CM | POA: Diagnosis not present

## 2023-04-09 DIAGNOSIS — R5383 Other fatigue: Secondary | ICD-10-CM | POA: Diagnosis not present

## 2023-04-09 DIAGNOSIS — I25798 Atherosclerosis of other coronary artery bypass graft(s) with other forms of angina pectoris: Secondary | ICD-10-CM | POA: Diagnosis not present

## 2023-04-09 DIAGNOSIS — R0602 Shortness of breath: Secondary | ICD-10-CM | POA: Diagnosis not present

## 2023-04-09 DIAGNOSIS — I10 Essential (primary) hypertension: Secondary | ICD-10-CM | POA: Diagnosis not present

## 2023-04-09 DIAGNOSIS — M839 Adult osteomalacia, unspecified: Secondary | ICD-10-CM | POA: Diagnosis not present

## 2023-04-09 DIAGNOSIS — E785 Hyperlipidemia, unspecified: Secondary | ICD-10-CM | POA: Diagnosis not present

## 2023-04-09 DIAGNOSIS — Z79899 Other long term (current) drug therapy: Secondary | ICD-10-CM | POA: Diagnosis not present

## 2023-04-09 DIAGNOSIS — R350 Frequency of micturition: Secondary | ICD-10-CM | POA: Diagnosis not present

## 2023-04-09 DIAGNOSIS — E0869 Diabetes mellitus due to underlying condition with other specified complication: Secondary | ICD-10-CM | POA: Diagnosis not present

## 2023-04-09 DIAGNOSIS — Z125 Encounter for screening for malignant neoplasm of prostate: Secondary | ICD-10-CM | POA: Diagnosis not present

## 2023-05-01 ENCOUNTER — Ambulatory Visit: Payer: Medicare Other | Admitting: Internal Medicine

## 2023-05-07 ENCOUNTER — Encounter: Payer: Self-pay | Admitting: Internal Medicine

## 2023-05-07 ENCOUNTER — Ambulatory Visit: Payer: Medicare Other | Admitting: Internal Medicine

## 2023-05-07 VITALS — BP 120/70 | HR 67 | Temp 98.2°F | Resp 18 | Ht 64.0 in | Wt 151.4 lb

## 2023-05-07 DIAGNOSIS — K219 Gastro-esophageal reflux disease without esophagitis: Secondary | ICD-10-CM | POA: Diagnosis not present

## 2023-05-07 DIAGNOSIS — E785 Hyperlipidemia, unspecified: Secondary | ICD-10-CM

## 2023-05-07 DIAGNOSIS — R5383 Other fatigue: Secondary | ICD-10-CM | POA: Diagnosis not present

## 2023-05-07 DIAGNOSIS — D649 Anemia, unspecified: Secondary | ICD-10-CM

## 2023-05-07 DIAGNOSIS — E1169 Type 2 diabetes mellitus with other specified complication: Secondary | ICD-10-CM | POA: Diagnosis not present

## 2023-05-07 DIAGNOSIS — I25718 Atherosclerosis of autologous vein coronary artery bypass graft(s) with other forms of angina pectoris: Secondary | ICD-10-CM | POA: Diagnosis not present

## 2023-05-07 DIAGNOSIS — E871 Hypo-osmolality and hyponatremia: Secondary | ICD-10-CM | POA: Diagnosis not present

## 2023-05-07 MED ORDER — PRALUENT 75 MG/ML ~~LOC~~ SOAJ: 1.0000 mL | SUBCUTANEOUS | 3 refills | Status: DC

## 2023-05-07 MED ORDER — OMEPRAZOLE MAGNESIUM 20 MG PO TBEC: 40.0000 mg | DELAYED_RELEASE_TABLET | Freq: Every day | ORAL | 6 refills | Status: DC

## 2023-05-07 MED ORDER — METFORMIN HCL ER 500 MG PO TB24: 1000.0000 mg | ORAL_TABLET | Freq: Every day | ORAL | 3 refills | Status: DC

## 2023-05-07 NOTE — Progress Notes (Unsigned)
   Office Visit  Subjective   Patient ID: Paul Villarreal   DOB: Dec 03, 1940   Age: 82 y.o.   MRN: [id number]   Chief Complaint Chief Complaint  Patient presents with   Follow-up    Hyponatremia     History of Present Illness HPI   Past Medical History Past Medical History:  Diagnosis Date   Anemia    Arthritis    Coronary artery disease    Decreased hearing    has hearing aids/Bil   Degenerative lumbar spinal stenosis    Diabetes mellitus without complication (HCC)    GERD (gastroesophageal reflux disease)    Heart disease    Hyperlipemia    Iron deficiency    Lower extremity pain    Myocardial infarction [facility]) 2008   Bypass surg   Nonintractable chronic migraine    no per daughter     Allergies Allergies  Allergen Reactions   Oxycodone Other (See Comments)    Caused bowel obstruction Caused bowel obstruction Caused bowel obstruction Caused bowel obstruction Intestinal Obstruction     Pork-Derived Products Other (See Comments)    Religious indication     Review of Systems ROS     Objective:    Vitals BP 120/70 (BP Location: Left Arm, Patient Position: Sitting, Cuff Size: Normal)   Pulse 67   Temp 98.2 F (36.8 C)   Resp 18   Ht 5\' 4"  (1.626 m)   Wt 151 lb 6 oz (68.7 kg)   SpO2 97%   BMI 25.98 kg/m    Physical Examination Physical Exam     Assessment & Plan:   No problem-specific Assessment & Plan notes found for this encounter.    No follow-ups on file.   Eloisa Northern, MD

## 2023-05-08 MED ORDER — SODIUM CHLORIDE 1 G PO TABS: 1.0000 g | ORAL_TABLET | Freq: Three times a day (TID) | ORAL | 6 refills | Status: AC

## 2023-05-08 NOTE — Assessment & Plan Note (Signed)
He has dilutional hyponatremia.  He can also take salt tablet.

## 2023-05-08 NOTE — Assessment & Plan Note (Signed)
Chronic anemia stable

## 2023-05-08 NOTE — Assessment & Plan Note (Signed)
He wanted to stop Dexilant because that is making him feel bad all day.  He wanted to go back to omeprazole.

## 2023-05-08 NOTE — Assessment & Plan Note (Signed)
Stable

## 2023-05-08 NOTE — Assessment & Plan Note (Signed)
His hemoglobin A1c was 6.1 and lipid panel is target controlled.  His urine microalbuminuria was negative.  He has already seen eye doctor for diabetic eye exam.

## 2023-05-08 NOTE — Assessment & Plan Note (Signed)
With chronic anemia, fatigue and elevated serum protein I will do serum SPEP.

## 2023-05-09 LAB — PE AND FLC, SERUM
A/G Ratio: 1 (ref 0.7–1.7)
Albumin ELP: 3.7 g/dL (ref 2.9–4.4)
Alpha 1: 0.2 g/dL (ref 0.0–0.4)
Alpha 2: 0.8 g/dL (ref 0.4–1.0)
Beta: 1.1 g/dL (ref 0.7–1.3)
Gamma Globulin: 1.6 g/dL (ref 0.4–1.8)
Globulin, Total: 3.8 g/dL (ref 2.2–3.9)
Ig Kappa Free Light Chain: 40.7 mg/L — ABNORMAL HIGH (ref 3.3–19.4)
Ig Lambda Free Light Chain: 26 mg/L (ref 5.7–26.3)
KAPPA/LAMBDA RATIO: 1.57 (ref 0.26–1.65)
Total Protein: 7.5 g/dL (ref 6.0–8.5)

## 2023-06-10 ENCOUNTER — Ambulatory Visit: Payer: Medicare Other | Admitting: Internal Medicine

## 2023-06-22 ENCOUNTER — Other Ambulatory Visit: Payer: Self-pay | Admitting: Internal Medicine

## 2023-06-23 ENCOUNTER — Other Ambulatory Visit: Payer: Self-pay | Admitting: Internal Medicine

## 2023-06-23 ENCOUNTER — Other Ambulatory Visit: Payer: Self-pay

## 2023-08-03 ENCOUNTER — Other Ambulatory Visit: Payer: Self-pay | Admitting: Internal Medicine

## 2023-09-10 ENCOUNTER — Encounter: Payer: Medicare Other | Admitting: Internal Medicine

## 2023-10-13 DIAGNOSIS — I080 Rheumatic disorders of both mitral and aortic valves: Secondary | ICD-10-CM | POA: Diagnosis not present

## 2023-10-13 DIAGNOSIS — E119 Type 2 diabetes mellitus without complications: Secondary | ICD-10-CM | POA: Diagnosis not present

## 2023-10-13 DIAGNOSIS — T82855A Stenosis of coronary artery stent, initial encounter: Secondary | ICD-10-CM | POA: Diagnosis not present

## 2023-10-13 DIAGNOSIS — R079 Chest pain, unspecified: Secondary | ICD-10-CM | POA: Diagnosis not present

## 2023-10-13 DIAGNOSIS — J8410 Pulmonary fibrosis, unspecified: Secondary | ICD-10-CM | POA: Diagnosis not present

## 2023-10-13 DIAGNOSIS — I200 Unstable angina: Secondary | ICD-10-CM | POA: Diagnosis not present

## 2023-10-13 DIAGNOSIS — I2582 Chronic total occlusion of coronary artery: Secondary | ICD-10-CM | POA: Diagnosis not present

## 2023-10-13 DIAGNOSIS — I10 Essential (primary) hypertension: Secondary | ICD-10-CM | POA: Diagnosis not present

## 2023-10-13 DIAGNOSIS — Z7982 Long term (current) use of aspirin: Secondary | ICD-10-CM | POA: Diagnosis not present

## 2023-10-13 DIAGNOSIS — R0789 Other chest pain: Secondary | ICD-10-CM | POA: Diagnosis not present

## 2023-10-13 DIAGNOSIS — I38 Endocarditis, valve unspecified: Secondary | ICD-10-CM | POA: Diagnosis not present

## 2023-10-13 DIAGNOSIS — I350 Nonrheumatic aortic (valve) stenosis: Secondary | ICD-10-CM | POA: Diagnosis not present

## 2023-10-13 DIAGNOSIS — I25798 Atherosclerosis of other coronary artery bypass graft(s) with other forms of angina pectoris: Secondary | ICD-10-CM | POA: Diagnosis not present

## 2023-10-13 DIAGNOSIS — I252 Old myocardial infarction: Secondary | ICD-10-CM | POA: Diagnosis not present

## 2023-10-13 DIAGNOSIS — R918 Other nonspecific abnormal finding of lung field: Secondary | ICD-10-CM | POA: Diagnosis not present

## 2023-10-13 DIAGNOSIS — Z955 Presence of coronary angioplasty implant and graft: Secondary | ICD-10-CM | POA: Diagnosis not present

## 2023-10-13 DIAGNOSIS — Z7984 Long term (current) use of oral hypoglycemic drugs: Secondary | ICD-10-CM | POA: Diagnosis not present

## 2023-10-13 DIAGNOSIS — E785 Hyperlipidemia, unspecified: Secondary | ICD-10-CM | POA: Diagnosis not present

## 2023-10-13 DIAGNOSIS — Z951 Presence of aortocoronary bypass graft: Secondary | ICD-10-CM | POA: Diagnosis not present

## 2023-10-13 DIAGNOSIS — I2510 Atherosclerotic heart disease of native coronary artery without angina pectoris: Secondary | ICD-10-CM | POA: Diagnosis not present

## 2023-10-13 DIAGNOSIS — I25118 Atherosclerotic heart disease of native coronary artery with other forms of angina pectoris: Secondary | ICD-10-CM | POA: Diagnosis not present

## 2023-10-13 DIAGNOSIS — R0602 Shortness of breath: Secondary | ICD-10-CM | POA: Diagnosis not present

## 2023-10-14 DIAGNOSIS — I517 Cardiomegaly: Secondary | ICD-10-CM | POA: Diagnosis not present

## 2023-10-14 DIAGNOSIS — R0789 Other chest pain: Secondary | ICD-10-CM | POA: Diagnosis not present

## 2023-10-14 DIAGNOSIS — R0602 Shortness of breath: Secondary | ICD-10-CM | POA: Diagnosis not present

## 2023-10-14 DIAGNOSIS — I2510 Atherosclerotic heart disease of native coronary artery without angina pectoris: Secondary | ICD-10-CM | POA: Diagnosis not present

## 2023-10-17 DIAGNOSIS — E785 Hyperlipidemia, unspecified: Secondary | ICD-10-CM | POA: Diagnosis not present

## 2023-10-17 DIAGNOSIS — I10 Essential (primary) hypertension: Secondary | ICD-10-CM | POA: Diagnosis not present

## 2023-10-17 DIAGNOSIS — I25798 Atherosclerosis of other coronary artery bypass graft(s) with other forms of angina pectoris: Secondary | ICD-10-CM | POA: Diagnosis not present

## 2023-10-17 DIAGNOSIS — R0602 Shortness of breath: Secondary | ICD-10-CM | POA: Diagnosis not present

## 2023-10-17 DIAGNOSIS — I38 Endocarditis, valve unspecified: Secondary | ICD-10-CM | POA: Diagnosis not present

## 2023-10-21 DIAGNOSIS — R0602 Shortness of breath: Secondary | ICD-10-CM | POA: Diagnosis not present

## 2023-10-21 DIAGNOSIS — I25798 Atherosclerosis of other coronary artery bypass graft(s) with other forms of angina pectoris: Secondary | ICD-10-CM | POA: Diagnosis not present

## 2023-10-31 ENCOUNTER — Ambulatory Visit: Admitting: Internal Medicine

## 2023-10-31 ENCOUNTER — Encounter: Payer: Self-pay | Admitting: Internal Medicine

## 2023-10-31 VITALS — BP 122/70 | HR 73 | Temp 97.3°F | Resp 18 | Ht 64.0 in | Wt 143.4 lb

## 2023-10-31 DIAGNOSIS — E785 Hyperlipidemia, unspecified: Secondary | ICD-10-CM

## 2023-10-31 DIAGNOSIS — R5383 Other fatigue: Secondary | ICD-10-CM | POA: Diagnosis not present

## 2023-10-31 DIAGNOSIS — E1169 Type 2 diabetes mellitus with other specified complication: Secondary | ICD-10-CM

## 2023-10-31 DIAGNOSIS — N401 Enlarged prostate with lower urinary tract symptoms: Secondary | ICD-10-CM

## 2023-10-31 DIAGNOSIS — R351 Nocturia: Secondary | ICD-10-CM | POA: Diagnosis not present

## 2023-10-31 DIAGNOSIS — F320 Major depressive disorder, single episode, mild: Secondary | ICD-10-CM

## 2023-10-31 DIAGNOSIS — I25718 Atherosclerosis of autologous vein coronary artery bypass graft(s) with other forms of angina pectoris: Secondary | ICD-10-CM

## 2023-10-31 DIAGNOSIS — F331 Major depressive disorder, recurrent, moderate: Secondary | ICD-10-CM

## 2023-10-31 MED ORDER — OMEPRAZOLE 20 MG PO CPDR: 20.0000 mg | DELAYED_RELEASE_CAPSULE | Freq: Two times a day (BID) | ORAL | 3 refills | Status: DC

## 2023-10-31 MED ORDER — OMEPRAZOLE 40 MG PO CPDR: 40.0000 mg | DELAYED_RELEASE_CAPSULE | Freq: Every day | ORAL | 6 refills | Status: DC

## 2023-10-31 MED ORDER — TAMSULOSIN HCL 0.4 MG PO CAPS: 0.4000 mg | ORAL_CAPSULE | Freq: Every day | ORAL | 6 refills | Status: DC

## 2023-10-31 MED ORDER — GABAPENTIN 300 MG PO CAPS: 300.0000 mg | ORAL_CAPSULE | Freq: Three times a day (TID) | ORAL | 3 refills | Status: DC

## 2023-10-31 MED ORDER — BUPROPION HCL 75 MG PO TABS: 75.0000 mg | ORAL_TABLET | Freq: Every day | ORAL | 6 refills | Status: DC

## 2023-10-31 NOTE — Addendum Note (Signed)
 Addended byEloisa Northern on: 10/31/2023 12:31 PM   Modules accepted: Orders

## 2023-10-31 NOTE — Progress Notes (Addendum)
 Office Visit  Subjective   Patient ID: Paul Villarreal   DOB: [birth year]   Age: 83 y.o.   MRN: [id number]   Chief Complaint Chief Complaint  Patient presents with   Hospitalization Follow-up    Hospital follow up     History of Present Illness 83 years old male is here for follow up from hospital discharge.  He has atypical chest pain and he was admitted to [facility] March 10 and discharged on October 14, 2023.  In the office visit his chest pain was 6 out of 10 and concerning for unstable angina so he was sent to the emergency room for room where he was admitted.  His troponin was negative.  His sodium level was 130.  EKG has old anteroseptal infarct changes no new change.  He underwent left heart catheterization on the same day that shows native three-vessel obstructive coronary artery disease with 4 out of 4 grafts patent.  He has mild to moderate aortic stenosis.  His D-dimer was elevated so he underwent CT that shows no evidence of pulmonary embolism.  He was started on Ranexa and he says that his chest pain got better and no further chest pain.  His ejection fraction was 50 to 55%.  I have reviewed hospital discharge summary and lab result.  He says that he still feels like that he has no energy.  He wake up 2-3 times at nighttime for urine urination.  That also interrupt his sleep.  His family believes that he is depressed also.   He has a history of dilutional hyponatremia but he says that he does not drink enough fluid.   His diabetes mellitus and check his sugar at home.  His sugar is well-controlled and his last hemoglobin A1c was 6.3.  To reduce the burden of medication I have suggested to stop metformin that he was taking 2 tablets daily and we will monitor his blood sugar.  He also has hyperlipidemia and his LDL was target controlled 6 months ago.     Past Medical History Past Medical History:  Diagnosis Date   Anemia    Arthritis    Coronary artery disease     Decreased hearing    has hearing aids/Bil   Degenerative lumbar spinal stenosis    Diabetes mellitus without complication (HCC)    GERD (gastroesophageal reflux disease)    Heart disease    Hyperlipemia    Iron deficiency    Lower extremity pain    Myocardial infarction [facility]) 2008   Bypass surg   Nonintractable chronic migraine    no per daughter     Allergies Allergies  Allergen Reactions   Oxycodone Other (See Comments)    Caused bowel obstruction Caused bowel obstruction Caused bowel obstruction Caused bowel obstruction Intestinal Obstruction     Pork-Derived Products Other (See Comments)    Religious indication     Review of Systems Review of Systems  Constitutional:  Positive for malaise/fatigue.  HENT: Negative.    Respiratory: Negative.    Cardiovascular: Negative.   Gastrointestinal: Negative.   Genitourinary:  Positive for frequency.  Neurological:  Positive for weakness.  Psychiatric/Behavioral:  The patient has insomnia.        Objective:    Vitals BP 122/70 (BP Location: Left Arm, Patient Position: Sitting, Cuff Size: Normal)   Pulse 73   Temp (!) 97.3 F (36.3 C)   Resp 18   Ht 5\' 4"  (1.626 m)   Wt 143  lb 6 oz (65 kg)   SpO2 95%   BMI 24.61 kg/m    Physical Examination Physical Exam Constitutional:      Appearance: Normal appearance.  HENT:     Head: Normocephalic and atraumatic.  Cardiovascular:     Rate and Rhythm: Normal rate and regular rhythm.     Heart sounds: Normal heart sounds.  Pulmonary:     Effort: Pulmonary effort is normal.     Breath sounds: Normal breath sounds.  Abdominal:     General: Bowel sounds are normal.     Palpations: Abdomen is soft.  Neurological:     General: No focal deficit present.     Mental Status: He is alert and oriented to person, place, and time.        Assessment & Plan:   Coronary artery disease This pain resolved with Ranexa and cardiac catheterization shows all 4 grafts are  patent.  Dyslipidemia associated with type 2 diabetes mellitus (HCC) I have suggested to stop metformin and will monitor blood sugar.  Moderate episode of recurrent major depressive disorder (HCC) I will start him on bupropion 75 mg daily in the morning.  Benign prostatic hyperplasia with nocturia I will start him on Flomax 0.4 mg at nighttime.  Lethargy I will do CBC and CMP and then reevaluate.    Return in about 1 month (around 12/01/2023).   Eloisa Northern, MD

## 2023-11-01 LAB — CBC WITH DIFFERENTIAL/PLATELET
Basophils Absolute: 0.1 10*3/uL (ref 0.0–0.2)
Basos: 1 %
EOS (ABSOLUTE): 0.5 10*3/uL — ABNORMAL HIGH (ref 0.0–0.4)
Eos: 5 %
Hematocrit: 38.6 % (ref 37.5–51.0)
Hemoglobin: 12.2 g/dL — ABNORMAL LOW (ref 13.0–17.7)
Immature Grans (Abs): 0.1 10*3/uL (ref 0.0–0.1)
Immature Granulocytes: 1 %
Lymphocytes Absolute: 1.8 10*3/uL (ref 0.7–3.1)
Lymphs: 18 %
MCH: 27.1 pg (ref 26.6–33.0)
MCHC: 31.6 g/dL (ref 31.5–35.7)
MCV: 86 fL (ref 79–97)
Monocytes Absolute: 0.8 10*3/uL (ref 0.1–0.9)
Monocytes: 8 %
Neutrophils Absolute: 6.8 10*3/uL (ref 1.4–7.0)
Neutrophils: 67 %
Platelets: 454 10*3/uL — ABNORMAL HIGH (ref 150–450)
RBC: 4.51 x10E6/uL (ref 4.14–5.80)
RDW: 13.3 % (ref 11.6–15.4)
WBC: 10.1 10*3/uL (ref 3.4–10.8)

## 2023-11-01 LAB — CMP14 + ANION GAP
ALT: 14 IU/L (ref 0–44)
AST: 23 IU/L (ref 0–40)
Albumin: 4.4 g/dL (ref 3.7–4.7)
Alkaline Phosphatase: 93 IU/L (ref 44–121)
Anion Gap: 17 mmol/L (ref 10.0–18.0)
BUN/Creatinine Ratio: 18 (ref 10–24)
BUN: 22 mg/dL (ref 8–27)
Bilirubin Total: 0.3 mg/dL (ref 0.0–1.2)
CO2: 19 mmol/L — ABNORMAL LOW (ref 20–29)
Calcium: 9.2 mg/dL (ref 8.6–10.2)
Chloride: 93 mmol/L — ABNORMAL LOW (ref 96–106)
Creatinine, Ser: 1.21 mg/dL (ref 0.76–1.27)
Globulin, Total: 3.9 g/dL (ref 1.5–4.5)
Glucose: 112 mg/dL — ABNORMAL HIGH (ref 70–99)
Potassium: 5.1 mmol/L (ref 3.5–5.2)
Sodium: 129 mmol/L — ABNORMAL LOW (ref 134–144)
Total Protein: 8.3 g/dL (ref 6.0–8.5)
eGFR: 59 mL/min/{1.73_m2} — ABNORMAL LOW (ref >59)

## 2023-11-01 LAB — URINALYSIS, ROUTINE W REFLEX MICROSCOPIC
Bilirubin, UA: NEGATIVE
Glucose, UA: NEGATIVE
Ketones, UA: NEGATIVE
Nitrite, UA: NEGATIVE
RBC, UA: NEGATIVE
Specific Gravity, UA: 1.019 (ref 1.005–1.030)
Urobilinogen, Ur: 0.2 mg/dL (ref 0.2–1.0)
pH, UA: 7 (ref 5.0–7.5)

## 2023-11-01 LAB — MICROSCOPIC EXAMINATION
Bacteria, UA: NONE SEEN
Casts: NONE SEEN /LPF
Epithelial Cells (non renal): NONE SEEN /HPF (ref 0–10)
RBC, Urine: NONE SEEN /HPF (ref 0–2)
WBC, UA: NONE SEEN /HPF (ref 0–5)

## 2023-11-04 NOTE — Assessment & Plan Note (Signed)
 I have suggested to stop metformin and will monitor blood sugar.

## 2023-11-04 NOTE — Assessment & Plan Note (Signed)
 I will do CBC and CMP and then reevaluate.

## 2023-11-04 NOTE — Assessment & Plan Note (Signed)
 I will start him on Flomax 0.4 mg at nighttime.

## 2023-11-04 NOTE — Assessment & Plan Note (Signed)
 I will start him on bupropion 75 mg daily in the morning.

## 2023-11-04 NOTE — Assessment & Plan Note (Signed)
 This pain resolved with Ranexa and cardiac catheterization shows all 4 grafts are patent.

## 2023-11-05 ENCOUNTER — Inpatient Hospital Stay: Admitting: Internal Medicine

## 2023-11-05 DIAGNOSIS — H6123 Impacted cerumen, bilateral: Secondary | ICD-10-CM | POA: Diagnosis not present

## 2023-11-05 DIAGNOSIS — H903 Sensorineural hearing loss, bilateral: Secondary | ICD-10-CM | POA: Diagnosis not present

## 2023-11-19 DIAGNOSIS — I25798 Atherosclerosis of other coronary artery bypass graft(s) with other forms of angina pectoris: Secondary | ICD-10-CM | POA: Diagnosis not present

## 2023-11-19 DIAGNOSIS — I38 Endocarditis, valve unspecified: Secondary | ICD-10-CM | POA: Diagnosis not present

## 2023-11-19 DIAGNOSIS — E785 Hyperlipidemia, unspecified: Secondary | ICD-10-CM | POA: Diagnosis not present

## 2023-11-22 ENCOUNTER — Other Ambulatory Visit: Payer: Self-pay | Admitting: Internal Medicine

## 2023-11-27 DIAGNOSIS — H903 Sensorineural hearing loss, bilateral: Secondary | ICD-10-CM | POA: Diagnosis not present

## 2023-11-27 DIAGNOSIS — H6123 Impacted cerumen, bilateral: Secondary | ICD-10-CM | POA: Diagnosis not present

## 2024-01-03 ENCOUNTER — Other Ambulatory Visit: Payer: Self-pay | Admitting: Internal Medicine

## 2024-02-13 ENCOUNTER — Other Ambulatory Visit: Payer: Self-pay | Admitting: Internal Medicine

## 2024-02-13 ENCOUNTER — Encounter: Payer: Self-pay | Admitting: Internal Medicine

## 2024-02-13 ENCOUNTER — Ambulatory Visit: Admitting: Internal Medicine

## 2024-02-13 VITALS — BP 142/80 | HR 76 | Temp 98.2°F | Resp 18 | Ht 64.0 in | Wt 149.0 lb

## 2024-02-13 DIAGNOSIS — R5383 Other fatigue: Secondary | ICD-10-CM

## 2024-02-13 DIAGNOSIS — R0781 Pleurodynia: Secondary | ICD-10-CM | POA: Insufficient documentation

## 2024-02-13 MED ORDER — ACCU-CHEK AVIVA PLUS VI STRP: ORAL_STRIP | Freq: Every day | 6 refills | Status: AC

## 2024-02-13 NOTE — Progress Notes (Unsigned)
     Acute Office Visit  Subjective:     Patient ID: Paul Villarreal, male    DOB: 1941/07/31, 83 y.o.   MRN: [id number]  Chief Complaint  Patient presents with   office visit    Patient here for pain in his ribs     HPI Patient is in today for feeling tired all the time.   He says that he feels that he has no energy.  He has a history of anemia and is wondering if anemia has gotten worse.  He says that he sleeps 7 hours.  He wake up sometime 1-2 times.  His family is worried that he has a history of low sodium and wanted that to be evaluated again.  He says that his appetite is good.  He try to walk 25 minutes daily.    He was also complaining of right-sided chest pain in his right costal area and goes all the way is to his right side neck.  Pain get worse when he lied on or try to get up.  No relation with breathing.  No cough for shortness of breath.  He denies any relation with eating.  He points to the lateral side of right lower ribs in the costal area.   Review of Systems  Constitutional:  Positive for malaise/fatigue.  Respiratory: Negative.    Cardiovascular: Negative.   Neurological:  Positive for weakness.        Objective:    BP (!) 142/80   Pulse 76   Temp 98.2 F (36.8 C)   Resp 18   Ht 5' 4 (1.626 m)   Wt 149 lb (67.6 kg)   SpO2 96%   BMI 25.58 kg/m    Physical Exam Constitutional:      Appearance: Normal appearance.  HENT:     Head: Normocephalic and atraumatic.  Cardiovascular:     Rate and Rhythm: Normal rate and regular rhythm.     Heart sounds: Normal heart sounds.  Pulmonary:     Effort: Pulmonary effort is normal.     Breath sounds: Normal breath sounds.  Neurological:     Mental Status: He is alert.     No results found for any visits on 02/13/24.      Assessment & Plan:   Problem List Items Addressed This Visit       Other   Fatigue     He has a history of anemia and hyponatremia so I will do CBC and CMP today.       Pleuritic chest pain - Primary    Not sure the cause for right lower lateral chest pain is, I have discussed with him if pain persisted then will do CT scan of chest.      Relevant Orders   CBC with Differential/Platelet (Completed)   CMP14 + Anion Gap (Completed)    No orders of the defined types were placed in this encounter.   No follow-ups on file.  Roetta Dare, MD

## 2024-02-14 LAB — CBC WITH DIFFERENTIAL/PLATELET
Basophils Absolute: 0.1 x10E3/uL (ref 0.0–0.2)
Basos: 1 %
EOS (ABSOLUTE): 0.6 x10E3/uL — ABNORMAL HIGH (ref 0.0–0.4)
Eos: 6 %
Hematocrit: 38.7 % (ref 37.5–51.0)
Hemoglobin: 12.3 g/dL — ABNORMAL LOW (ref 13.0–17.7)
Immature Grans (Abs): 0 x10E3/uL (ref 0.0–0.1)
Immature Granulocytes: 0 %
Lymphocytes Absolute: 2.1 x10E3/uL (ref 0.7–3.1)
Lymphs: 21 %
MCH: 27.6 pg (ref 26.6–33.0)
MCHC: 31.8 g/dL (ref 31.5–35.7)
MCV: 87 fL (ref 79–97)
Monocytes Absolute: 1 x10E3/uL — ABNORMAL HIGH (ref 0.1–0.9)
Monocytes: 10 %
Neutrophils Absolute: 6 x10E3/uL (ref 1.4–7.0)
Neutrophils: 62 %
Platelets: 326 x10E3/uL (ref 150–450)
RBC: 4.46 x10E6/uL (ref 4.14–5.80)
RDW: 13.4 % (ref 11.6–15.4)
WBC: 9.8 x10E3/uL (ref 3.4–10.8)

## 2024-02-14 LAB — CMP14 + ANION GAP
ALT: 15 IU/L (ref 0–44)
AST: 20 IU/L (ref 0–40)
Albumin: 4.3 g/dL (ref 3.7–4.7)
Alkaline Phosphatase: 98 IU/L (ref 44–121)
Anion Gap: 15 mmol/L (ref 10.0–18.0)
BUN/Creatinine Ratio: 23 (ref 10–24)
BUN: 25 mg/dL (ref 8–27)
Bilirubin Total: 0.3 mg/dL (ref 0.0–1.2)
CO2: 20 mmol/L (ref 20–29)
Calcium: 9.3 mg/dL (ref 8.6–10.2)
Chloride: 97 mmol/L (ref 96–106)
Creatinine, Ser: 1.08 mg/dL (ref 0.76–1.27)
Globulin, Total: 3.9 g/dL (ref 1.5–4.5)
Glucose: 107 mg/dL — ABNORMAL HIGH (ref 70–99)
Potassium: 5.2 mmol/L (ref 3.5–5.2)
Sodium: 132 mmol/L — ABNORMAL LOW (ref 134–144)
Total Protein: 8.2 g/dL (ref 6.0–8.5)
eGFR: 68 mL/min/1.73 (ref >59)

## 2024-02-16 ENCOUNTER — Other Ambulatory Visit: Payer: Self-pay | Admitting: Internal Medicine

## 2024-02-17 ENCOUNTER — Ambulatory Visit: Payer: Self-pay

## 2024-02-18 ENCOUNTER — Encounter: Payer: Self-pay | Admitting: Internal Medicine

## 2024-02-18 ENCOUNTER — Ambulatory Visit: Admitting: Internal Medicine

## 2024-02-18 VITALS — BP 120/74 | HR 73 | Temp 98.3°F | Resp 18 | Ht 64.0 in | Wt 149.0 lb

## 2024-02-18 DIAGNOSIS — I25718 Atherosclerosis of autologous vein coronary artery bypass graft(s) with other forms of angina pectoris: Secondary | ICD-10-CM

## 2024-02-18 DIAGNOSIS — J8410 Pulmonary fibrosis, unspecified: Secondary | ICD-10-CM | POA: Diagnosis not present

## 2024-02-18 DIAGNOSIS — Z6825 Body mass index (BMI) 25.0-25.9, adult: Secondary | ICD-10-CM

## 2024-02-18 DIAGNOSIS — E785 Hyperlipidemia, unspecified: Secondary | ICD-10-CM | POA: Diagnosis not present

## 2024-02-18 DIAGNOSIS — Z0000 Encounter for general adult medical examination without abnormal findings: Secondary | ICD-10-CM | POA: Diagnosis not present

## 2024-02-18 DIAGNOSIS — E1169 Type 2 diabetes mellitus with other specified complication: Secondary | ICD-10-CM | POA: Diagnosis not present

## 2024-02-18 DIAGNOSIS — R5383 Other fatigue: Secondary | ICD-10-CM | POA: Diagnosis not present

## 2024-02-18 DIAGNOSIS — D649 Anemia, unspecified: Secondary | ICD-10-CM

## 2024-02-18 DIAGNOSIS — K219 Gastro-esophageal reflux disease without esophagitis: Secondary | ICD-10-CM

## 2024-02-18 MED ORDER — GABAPENTIN 300 MG PO CAPS: 300.0000 mg | ORAL_CAPSULE | Freq: Three times a day (TID) | ORAL | 3 refills | Status: AC

## 2024-02-18 MED ORDER — SHINGRIX 50 MCG/0.5ML IM SUSR
0.5000 mL | Freq: Once | INTRAMUSCULAR | 0 refills | Status: AC
Start: 2024-02-18 — End: 2024-02-18

## 2024-02-18 NOTE — Progress Notes (Unsigned)
 Office Visit  Subjective   Patient ID: Paul Villarreal   DOB: 1941/03/06   Age: 83 y.o.   MRN: [id number]   Chief Complaint Chief Complaint  Patient presents with   Follow-up    Follow up      History of Present Illness 83 years old male is here for annual wellness examination. He is independent in all activites of daily living. He does not drive. He fell one time from last stair when he miss step. No injury from that fall. He says that he sometimes feel that he is getting forgetful. He score 29/30 on MMSE.  His 1 minutes word recall was 3/3.  He is not depressed.    He get flu shot every year.  He also has pneumonia vaccine.  His tetanus vaccine was less than 5 years ago.  He never have shingle vaccine.    He was seen by me last week because of feeling tired and feels like that he has no energy.  He has a mild anemia where hemoglobin is 12.3.  he also has pulmonary fibrosis and is not taking any treatment.  He says that he walked 25 minutes daily and does get short of breath at the end.   He has coronary artery disease and he follows with cardiologist.  He denies any chest pain.  He could not tolerate statin because of muscle and body pain.  He is taking Repatha injection twice a month.  He denies any side effect from that.  He is due for lipid panel today.    He also has diabetes mellitus, his blood sugar was low so I have stopped his blood pressure medication because of risk of hypoglycemia.  He still check his blood sugar at home.  His blood sugar is better and his hemoglobin A1c was 6.2 on April 09, 2023. He has tingling and numbness in his feet and he takes gabapentin  300 mg twice a day that does help.  His diabetic foot exam today was normal.    He also has hyponatremia because of excessive free water intake.  His sodium level was 132 last week.    He is a widow but he is not depressed.  He also has back and arm pain but that is better now.  He has a history of  gastroesophageal reflux disease and he takes omeprazole  40 mg daily.  Past Medical History Past Medical History:  Diagnosis Date   Anemia    Arthritis    Coronary artery disease    Decreased hearing    has hearing aids/Bil   Degenerative lumbar spinal stenosis    Diabetes mellitus without complication (HCC)    GERD (gastroesophageal reflux disease)    Heart disease    Hyperlipemia    Iron deficiency    Lower extremity pain    Myocardial infarction [facility]) 2008   Bypass surg   Nonintractable chronic migraine    no per daughter     Allergies Allergies  Allergen Reactions   Oxycodone  Other (See Comments)    Caused bowel obstruction Caused bowel obstruction Caused bowel obstruction Caused bowel obstruction Intestinal Obstruction     Pork-Derived Products Other (See Comments)    Religious indication     Review of Systems Review of Systems  Constitutional: Negative.        Feeling tired  HENT: Negative.    Respiratory: Negative.    Cardiovascular: Negative.   Gastrointestinal: Negative.   Neurological: Negative.  Objective:    Vitals BP 120/74   Pulse 73   Temp 98.3 F (36.8 C)   Resp 18   Ht 5' 4 (1.626 m)   Wt 149 lb (67.6 kg)   SpO2 97%   BMI 25.58 kg/m    Physical Examination Physical Exam Constitutional:      Appearance: Normal appearance.  HENT:     Head: Normocephalic and atraumatic.  Cardiovascular:     Rate and Rhythm: Normal rate and regular rhythm.     Heart sounds: Normal heart sounds.  Pulmonary:     Effort: Pulmonary effort is normal.     Breath sounds: Normal breath sounds.  Abdominal:     General: Bowel sounds are normal.     Palpations: Abdomen is soft.  Neurological:     General: No focal deficit present.     Mental Status: He is alert and oriented to person, place, and time.        Assessment & Plan:   Coronary artery disease   Stable and he will continue to follow with cardiologist.  GERD (gastroesophageal  reflux disease)   He is taking omeprazole  40 mg daily and his symptoms are well controlled.  Dyslipidemia associated with type 2 diabetes mellitus (HCC)   His diabetes is controlled with diet now.  He has seen eye doctor within last 1 year.  I will do hemoglobin A1c, urine microalbumin urea  and lipid panel today.  He takes Alirocumab  75 mg every 2 weeks.   Well adult exam  I will send prescription for Shingrix  vaccine.  He will take 2 injection 3-6 months apart.  Lethargy   That is probably combination of mild anemia and pulmonary fibrosis.  Anemia   I will do anemia panel.    Return in about 3 months (around 05/20/2024).   Roetta Dare, MD

## 2024-02-19 DIAGNOSIS — J8410 Pulmonary fibrosis, unspecified: Secondary | ICD-10-CM | POA: Insufficient documentation

## 2024-02-19 NOTE — Assessment & Plan Note (Signed)
 I will send prescription for Shingrix  vaccine.  He will take 2 injection 3-6 months apart.

## 2024-02-19 NOTE — Assessment & Plan Note (Signed)
 That is probably combination of mild anemia and pulmonary fibrosis.

## 2024-02-19 NOTE — Assessment & Plan Note (Signed)
 Not sure the cause for right lower lateral chest pain is, I have discussed with him if pain persisted then will do CT scan of chest.

## 2024-02-19 NOTE — Assessment & Plan Note (Signed)
 He has a history of anemia and hyponatremia so I will do CBC and CMP today.

## 2024-02-19 NOTE — Assessment & Plan Note (Signed)
I will do anemia panel.

## 2024-02-19 NOTE — Assessment & Plan Note (Signed)
 His diabetes is controlled with diet now.  He has seen eye doctor within last 1 year.  I will do hemoglobin A1c, urine microalbumin urea  and lipid panel today.  He takes Alirocumab  75 mg every 2 weeks.

## 2024-02-19 NOTE — Assessment & Plan Note (Signed)
Stable and he will continue to follow with cardiologist

## 2024-02-19 NOTE — Assessment & Plan Note (Signed)
 He is taking omeprazole  40 mg daily and his symptoms are well controlled.

## 2024-02-20 LAB — LIPID PANEL
Chol/HDL Ratio: 3.7 ratio (ref 0.0–5.0)
Cholesterol, Total: 155 mg/dL (ref 100–199)
HDL: 42 mg/dL (ref >39)
LDL Chol Calc (NIH): 69 mg/dL (ref 0–99)
Triglycerides: 276 mg/dL — ABNORMAL HIGH (ref 0–149)
VLDL Cholesterol Cal: 44 mg/dL — ABNORMAL HIGH (ref 5–40)

## 2024-02-20 LAB — ANEMIA PANEL
Ferritin: 321 ng/mL (ref 30–400)
Hematocrit: 40.8 % (ref 37.5–51.0)
Iron Saturation: 9 % — CL (ref 15–55)
Iron: 30 ug/dL — ABNORMAL LOW (ref 38–169)
Retic Ct Pct: 1.7 % (ref 0.6–2.6)
Total Iron Binding Capacity: 341 ug/dL (ref 250–450)
UIBC: 311 ug/dL (ref 111–343)
Vitamin B-12: 818 pg/mL (ref 232–1245)

## 2024-02-20 LAB — HEMOGLOBIN A1C
Est. average glucose Bld gHb Est-mCnc: 146 mg/dL
Hgb A1c MFr Bld: 6.7 % — ABNORMAL HIGH (ref 4.8–5.6)

## 2024-02-20 LAB — MICROALBUMIN / CREATININE URINE RATIO
Creatinine, Urine: 89.4 mg/dL
Microalb/Creat Ratio: 45 mg/g{creat} — ABNORMAL HIGH (ref 0–29)
Microalbumin, Urine: 40.3 ug/mL

## 2024-02-24 ENCOUNTER — Ambulatory Visit: Payer: Self-pay

## 2024-02-25 ENCOUNTER — Other Ambulatory Visit: Payer: Self-pay

## 2024-03-01 ENCOUNTER — Other Ambulatory Visit: Payer: Self-pay

## 2024-03-01 MED ORDER — ACCU-CHEK AVIVA PLUS VI STRP: ORAL_STRIP | 4 refills | Status: AC

## 2024-03-03 ENCOUNTER — Other Ambulatory Visit: Payer: Self-pay | Admitting: Internal Medicine

## 2024-03-03 MED ORDER — FERROUS GLUCONATE 324 (38 FE) MG PO TABS: 324.0000 mg | ORAL_TABLET | Freq: Every day | ORAL | 6 refills | Status: AC

## 2024-05-10 ENCOUNTER — Other Ambulatory Visit: Payer: Self-pay | Admitting: Internal Medicine

## 2024-05-15 ENCOUNTER — Other Ambulatory Visit: Payer: Self-pay | Admitting: Internal Medicine

## 2024-05-25 DIAGNOSIS — E785 Hyperlipidemia, unspecified: Secondary | ICD-10-CM | POA: Diagnosis not present

## 2024-05-25 DIAGNOSIS — I38 Endocarditis, valve unspecified: Secondary | ICD-10-CM | POA: Diagnosis not present

## 2024-05-25 DIAGNOSIS — I25798 Atherosclerosis of other coronary artery bypass graft(s) with other forms of angina pectoris: Secondary | ICD-10-CM | POA: Diagnosis not present

## 2024-05-25 DIAGNOSIS — R0602 Shortness of breath: Secondary | ICD-10-CM | POA: Diagnosis not present

## 2024-05-25 DIAGNOSIS — E0869 Diabetes mellitus due to underlying condition with other specified complication: Secondary | ICD-10-CM | POA: Diagnosis not present

## 2024-05-25 DIAGNOSIS — I10 Essential (primary) hypertension: Secondary | ICD-10-CM | POA: Diagnosis not present

## 2024-05-28 DIAGNOSIS — I25798 Atherosclerosis of other coronary artery bypass graft(s) with other forms of angina pectoris: Secondary | ICD-10-CM | POA: Diagnosis not present

## 2024-05-28 DIAGNOSIS — E785 Hyperlipidemia, unspecified: Secondary | ICD-10-CM | POA: Diagnosis not present

## 2024-05-28 DIAGNOSIS — R0602 Shortness of breath: Secondary | ICD-10-CM | POA: Diagnosis not present

## 2024-05-28 DIAGNOSIS — E0869 Diabetes mellitus due to underlying condition with other specified complication: Secondary | ICD-10-CM | POA: Diagnosis not present

## 2024-06-09 DIAGNOSIS — H43822 Vitreomacular adhesion, left eye: Secondary | ICD-10-CM | POA: Diagnosis not present

## 2024-06-12 DIAGNOSIS — Z23 Encounter for immunization: Secondary | ICD-10-CM | POA: Diagnosis not present

## 2024-07-15 ENCOUNTER — Other Ambulatory Visit: Payer: Self-pay | Admitting: Internal Medicine
